# Patient Record
Sex: Female | Born: 1960
Health system: Southern US, Community
[De-identification: ages and names within clinical notes are randomized; demographics above are authoritative.]

## PROBLEM LIST (undated history)

## (undated) DIAGNOSIS — E7800 Pure hypercholesterolemia, unspecified: Secondary | ICD-10-CM

## (undated) DIAGNOSIS — R000 Tachycardia, unspecified: Secondary | ICD-10-CM

## (undated) DIAGNOSIS — I10 Essential (primary) hypertension: Secondary | ICD-10-CM

## (undated) DIAGNOSIS — H409 Unspecified glaucoma: Secondary | ICD-10-CM

## (undated) HISTORY — PX: NO PAST SURGERIES: SHX2092

## (undated) HISTORY — PX: OTHER SURGICAL HISTORY: SHX169

## (undated) HISTORY — DX: Essential (primary) hypertension: I10

---

## 2002-09-29 ENCOUNTER — Other Ambulatory Visit: Admission: RE | Admit: 2002-09-29 | Discharge: 2002-09-29 | Payer: Self-pay | Admitting: Obstetrics & Gynecology

## 2003-09-30 ENCOUNTER — Ambulatory Visit (HOSPITAL_COMMUNITY): Admission: RE | Admit: 2003-09-30 | Discharge: 2003-09-30 | Payer: Self-pay | Admitting: Family Medicine

## 2010-09-25 ENCOUNTER — Ambulatory Visit (HOSPITAL_COMMUNITY)
Admission: RE | Admit: 2010-09-25 | Discharge: 2010-09-25 | Payer: Self-pay | Source: Home / Self Care | Attending: Internal Medicine | Admitting: Internal Medicine

## 2010-10-14 ENCOUNTER — Encounter: Payer: Self-pay | Admitting: Family Medicine

## 2011-11-12 ENCOUNTER — Other Ambulatory Visit (HOSPITAL_COMMUNITY): Payer: Self-pay | Admitting: Internal Medicine

## 2011-11-13 ENCOUNTER — Other Ambulatory Visit (HOSPITAL_COMMUNITY): Payer: Self-pay | Admitting: Internal Medicine

## 2011-11-13 DIAGNOSIS — E785 Hyperlipidemia, unspecified: Secondary | ICD-10-CM

## 2011-11-13 DIAGNOSIS — Z139 Encounter for screening, unspecified: Secondary | ICD-10-CM

## 2011-11-13 DIAGNOSIS — Z01419 Encounter for gynecological examination (general) (routine) without abnormal findings: Secondary | ICD-10-CM

## 2011-11-13 DIAGNOSIS — R51 Headache: Secondary | ICD-10-CM

## 2011-11-15 ENCOUNTER — Ambulatory Visit (HOSPITAL_COMMUNITY)
Admission: RE | Admit: 2011-11-15 | Discharge: 2011-11-15 | Disposition: A | Discharge status: Home / Self Care | Payer: BC Managed Care – PPO | Source: Ambulatory Visit | Attending: Internal Medicine | Admitting: Internal Medicine

## 2011-11-15 DIAGNOSIS — R51 Headache: Secondary | ICD-10-CM

## 2011-11-21 ENCOUNTER — Ambulatory Visit (HOSPITAL_COMMUNITY): Payer: BC Managed Care – PPO

## 2011-12-02 ENCOUNTER — Ambulatory Visit (HOSPITAL_COMMUNITY): Payer: BC Managed Care – PPO

## 2012-06-19 ENCOUNTER — Other Ambulatory Visit (HOSPITAL_COMMUNITY): Payer: Self-pay | Admitting: Internal Medicine

## 2012-06-19 DIAGNOSIS — Z01419 Encounter for gynecological examination (general) (routine) without abnormal findings: Secondary | ICD-10-CM

## 2012-06-25 ENCOUNTER — Ambulatory Visit (HOSPITAL_COMMUNITY): Payer: BC Managed Care – PPO

## 2012-06-25 ENCOUNTER — Other Ambulatory Visit (HOSPITAL_COMMUNITY): Payer: BC Managed Care – PPO

## 2012-06-26 ENCOUNTER — Other Ambulatory Visit (HOSPITAL_COMMUNITY): Payer: Self-pay | Admitting: Internal Medicine

## 2012-06-26 ENCOUNTER — Ambulatory Visit (HOSPITAL_COMMUNITY): Payer: BC Managed Care – PPO

## 2012-06-26 ENCOUNTER — Ambulatory Visit (HOSPITAL_COMMUNITY)
Admission: RE | Admit: 2012-06-26 | Discharge: 2012-06-26 | Disposition: A | Discharge status: Home / Self Care | Payer: BC Managed Care – PPO | Source: Ambulatory Visit | Attending: Internal Medicine | Admitting: Internal Medicine

## 2012-06-26 DIAGNOSIS — R221 Localized swelling, mass and lump, neck: Secondary | ICD-10-CM | POA: Insufficient documentation

## 2012-06-26 DIAGNOSIS — Z01419 Encounter for gynecological examination (general) (routine) without abnormal findings: Secondary | ICD-10-CM

## 2012-06-26 DIAGNOSIS — R220 Localized swelling, mass and lump, head: Secondary | ICD-10-CM | POA: Insufficient documentation

## 2012-06-26 DIAGNOSIS — M79609 Pain in unspecified limb: Secondary | ICD-10-CM | POA: Insufficient documentation

## 2012-06-26 DIAGNOSIS — I10 Essential (primary) hypertension: Secondary | ICD-10-CM | POA: Insufficient documentation

## 2012-12-22 ENCOUNTER — Other Ambulatory Visit (HOSPITAL_COMMUNITY): Payer: Self-pay | Admitting: Internal Medicine

## 2012-12-22 DIAGNOSIS — R1013 Epigastric pain: Secondary | ICD-10-CM

## 2012-12-22 DIAGNOSIS — Z0000 Encounter for general adult medical examination without abnormal findings: Secondary | ICD-10-CM

## 2012-12-25 ENCOUNTER — Ambulatory Visit (HOSPITAL_COMMUNITY): Payer: BC Managed Care – PPO

## 2012-12-28 ENCOUNTER — Ambulatory Visit (HOSPITAL_COMMUNITY): Payer: BC Managed Care – PPO

## 2012-12-31 ENCOUNTER — Ambulatory Visit (HOSPITAL_COMMUNITY)
Admission: RE | Admit: 2012-12-31 | Discharge: 2012-12-31 | Disposition: A | Discharge status: Home / Self Care | Payer: BC Managed Care – PPO | Source: Ambulatory Visit | Attending: Internal Medicine | Admitting: Internal Medicine

## 2012-12-31 DIAGNOSIS — Z1231 Encounter for screening mammogram for malignant neoplasm of breast: Secondary | ICD-10-CM | POA: Diagnosis not present

## 2012-12-31 DIAGNOSIS — Z0000 Encounter for general adult medical examination without abnormal findings: Secondary | ICD-10-CM

## 2012-12-31 DIAGNOSIS — R1011 Right upper quadrant pain: Secondary | ICD-10-CM | POA: Diagnosis present

## 2012-12-31 DIAGNOSIS — R1013 Epigastric pain: Secondary | ICD-10-CM

## 2014-06-22 ENCOUNTER — Other Ambulatory Visit (HOSPITAL_COMMUNITY): Payer: Self-pay | Admitting: Physician Assistant

## 2014-06-22 ENCOUNTER — Ambulatory Visit (HOSPITAL_COMMUNITY)
Admission: RE | Admit: 2014-06-22 | Discharge: 2014-06-22 | Disposition: A | Discharge status: Home / Self Care | Payer: BC Managed Care – PPO | Source: Ambulatory Visit | Attending: Physician Assistant | Admitting: Physician Assistant

## 2014-06-22 DIAGNOSIS — M79672 Pain in left foot: Secondary | ICD-10-CM

## 2014-06-22 DIAGNOSIS — M79609 Pain in unspecified limb: Secondary | ICD-10-CM | POA: Insufficient documentation

## 2014-06-22 DIAGNOSIS — M79671 Pain in right foot: Secondary | ICD-10-CM

## 2015-02-03 ENCOUNTER — Ambulatory Visit (HOSPITAL_COMMUNITY)
Admission: RE | Admit: 2015-02-03 | Discharge: 2015-02-03 | Disposition: A | Discharge status: Home / Self Care | Payer: BLUE CROSS/BLUE SHIELD | Source: Ambulatory Visit | Attending: Internal Medicine | Admitting: Internal Medicine

## 2015-02-03 ENCOUNTER — Other Ambulatory Visit (HOSPITAL_COMMUNITY): Payer: Self-pay | Admitting: Internal Medicine

## 2015-02-03 DIAGNOSIS — R079 Chest pain, unspecified: Secondary | ICD-10-CM | POA: Diagnosis present

## 2015-02-15 ENCOUNTER — Encounter (INDEPENDENT_AMBULATORY_CARE_PROVIDER_SITE_OTHER): Payer: Self-pay | Admitting: *Deleted

## 2015-06-05 ENCOUNTER — Other Ambulatory Visit (HOSPITAL_COMMUNITY): Payer: Self-pay | Admitting: Internal Medicine

## 2015-06-05 DIAGNOSIS — M545 Low back pain: Secondary | ICD-10-CM

## 2015-06-05 DIAGNOSIS — M546 Pain in thoracic spine: Secondary | ICD-10-CM

## 2016-01-18 DIAGNOSIS — H401122 Primary open-angle glaucoma, left eye, moderate stage: Secondary | ICD-10-CM | POA: Diagnosis not present

## 2016-03-27 DIAGNOSIS — R300 Dysuria: Secondary | ICD-10-CM | POA: Diagnosis not present

## 2016-03-27 DIAGNOSIS — Z1389 Encounter for screening for other disorder: Secondary | ICD-10-CM | POA: Diagnosis not present

## 2016-03-27 DIAGNOSIS — Z6828 Body mass index (BMI) 28.0-28.9, adult: Secondary | ICD-10-CM | POA: Diagnosis not present

## 2016-03-27 DIAGNOSIS — Z0001 Encounter for general adult medical examination with abnormal findings: Secondary | ICD-10-CM | POA: Diagnosis not present

## 2016-03-27 DIAGNOSIS — I10 Essential (primary) hypertension: Secondary | ICD-10-CM | POA: Diagnosis not present

## 2016-03-27 DIAGNOSIS — R010 Benign and innocent cardiac murmurs: Secondary | ICD-10-CM | POA: Diagnosis not present

## 2016-03-27 DIAGNOSIS — E663 Overweight: Secondary | ICD-10-CM | POA: Diagnosis not present

## 2016-03-29 DIAGNOSIS — Z6828 Body mass index (BMI) 28.0-28.9, adult: Secondary | ICD-10-CM | POA: Diagnosis not present

## 2016-03-29 DIAGNOSIS — Z1389 Encounter for screening for other disorder: Secondary | ICD-10-CM | POA: Diagnosis not present

## 2016-03-29 DIAGNOSIS — E663 Overweight: Secondary | ICD-10-CM | POA: Diagnosis not present

## 2016-05-20 ENCOUNTER — Encounter (INDEPENDENT_AMBULATORY_CARE_PROVIDER_SITE_OTHER): Payer: Self-pay

## 2016-05-20 ENCOUNTER — Encounter: Payer: Self-pay | Admitting: Internal Medicine

## 2016-05-20 ENCOUNTER — Ambulatory Visit (INDEPENDENT_AMBULATORY_CARE_PROVIDER_SITE_OTHER): Payer: BLUE CROSS/BLUE SHIELD | Admitting: Internal Medicine

## 2016-05-20 VITALS — BP 116/78 | HR 80 | Ht 65.0 in | Wt 180.0 lb

## 2016-05-20 DIAGNOSIS — R011 Cardiac murmur, unspecified: Secondary | ICD-10-CM | POA: Diagnosis not present

## 2016-05-20 NOTE — Patient Instructions (Signed)
Your physician recommends that you schedule a follow-up appointment in:  To be determined after echo    Your physician has requested that you have an echocardiogram. Echocardiography is a painless test that uses sound waves to create images of your heart. It provides your doctor with information about the size and shape of your heart and how well your heart's chambers and valves are working. This procedure takes approximately one hour. There are no restrictions for this procedure.     Your physician recommends that you continue on your current medications as directed. Please refer to the Current Medication list given to you today.     Thank you for choosing [facility] !         

## 2016-05-20 NOTE — Progress Notes (Signed)
**Note Jodi-Identified via Obfuscation**    Cardiology Office Note   Date:  05/20/2016   ID:  Jodi Hollingsheaderesa C Turney, DOB Mar 21, 1961, MRN [id number]  PCP:  Cassell SmilesFUSCO,LAWRENCE J., MD  Cardiologist:   Dietrich PatesPaula Kiora Hallberg, MD     Pt referred for murmur   History of Present Illness: Jodi Richardson is a 55 y.o. female with a history of HTN, HL  Referred for LD  On 01/2015 was 204  HDL was 65   Seen by Dr Sherwood GamblerFusco  Soft murmur heard Pt sits and moves  Some at work    Not much walking   No walking    Started having some pain under L breat when laid down  ot with activity   Sl pain at work      Outpatient Medications Prior to Visit  Medication Sig Dispense Refill  . losartan-hydrochlorothiazide (HYZAAR) 50-12.5 MG tablet Take 1 tablet by mouth daily.    Marland Kitchen. aspirin EC 81 MG tablet Take 81 mg by mouth daily.     No facility-administered medications prior to visit.    Fish oil   Eye drops (glaucoma)   Allergies:   Review of patient's allergies indicates no known allergies.     HTN    No past surgical history on file.     Social History:  The patient  reports that she has never smoked. She has never used smokeless tobacco. She reports that she does not drink alcohol or use drugs.   Family History:  The patient's family history is not on file.   Dad CVA HTN  ? Stent   Mom PAD  Stent to leg Brother/sisters:  Most with HTn  Chol high     ROS:  Please see the history of present illness. All other systems are reviewed and  Negative to the above problem except as noted.    PHYSICAL EXAM: VS:  BP 116/78 (BP Location: Right Arm)   Pulse 80   Ht 5\' 5"  (1.651 m)   Wt 180 lb (81.6 kg)   SpO2 99%   BMI 29.95 kg/m   GEN: Well nourished, well developed, in no acute distress  HEENT: normal  Neck: no JVD, carotid bruits, or masses Cardiac: RRR; Gr I-II/VI systolic murmur base , rubs, or gallops,Tr edema feeet Respiratory:  clear to auscultation bilaterally, normal work of breathing GI: soft, nontender, nondistended, + BS  No hepatomegaly  MS:  no deformity Moving all extremities   Skin: warm and dry, no rash Neuro:  Strength and sensation are intact Psych: euthymic mood, full affect   EKG:  EKG is ordered today.  SR 66 bpm    Lipid Panel No results found for: CHOL, TRIG, HDL, CHOLHDL, VLDL, LDLCALC, LDLDIRECT    Wt Readings from Last 3 Encounters:  05/20/16 180 lb (81.6 kg)      ASSESSMENT AND PLAN:  1  Murmur  Soft systolic   Probable aortic sclerosis  Will get echo  2  HL  Profound  LDL over 200  Will get this years labs  Prob needs statin  3.  Feet  [facility]  [facility]  Would do better if could walk     Signed, Dietrich PatesPaula Phala Schraeder, MD  05/20/2016 2:18 PM    [facility] [address], [place], [place]  [address] Phone: [phone]) [phone]; Fax: [phone]) [phone]

## 2016-05-24 ENCOUNTER — Telehealth: Payer: Self-pay

## 2016-05-24 ENCOUNTER — Other Ambulatory Visit: Payer: Self-pay

## 2016-05-24 DIAGNOSIS — E785 Hyperlipidemia, unspecified: Secondary | ICD-10-CM

## 2016-05-24 MED ORDER — ROSUVASTATIN CALCIUM 20 MG PO TABS: 20.0000 mg | ORAL_TABLET | Freq: Every day | ORAL | 3 refills | Status: DC

## 2016-05-24 NOTE — Telephone Encounter (Signed)
Send message to pt via my chart regarding lipid results

## 2016-06-17 ENCOUNTER — Encounter: Payer: Self-pay | Admitting: Internal Medicine

## 2016-06-21 ENCOUNTER — Other Ambulatory Visit (HOSPITAL_COMMUNITY): Payer: BLUE CROSS/BLUE SHIELD

## 2016-06-27 ENCOUNTER — Ambulatory Visit (HOSPITAL_COMMUNITY)
Admission: RE | Admit: 2016-06-27 | Discharge: 2016-06-27 | Disposition: A | Discharge status: Home / Self Care | Payer: BLUE CROSS/BLUE SHIELD | Source: Ambulatory Visit | Attending: Internal Medicine | Admitting: Internal Medicine

## 2016-06-27 DIAGNOSIS — I371 Nonrheumatic pulmonary valve insufficiency: Secondary | ICD-10-CM | POA: Insufficient documentation

## 2016-06-27 DIAGNOSIS — I071 Rheumatic tricuspid insufficiency: Secondary | ICD-10-CM | POA: Diagnosis not present

## 2016-06-27 DIAGNOSIS — R011 Cardiac murmur, unspecified: Secondary | ICD-10-CM | POA: Diagnosis not present

## 2016-06-27 LAB — ECHOCARDIOGRAM COMPLETE
AVLVOTPG: 6 mmHg
CHL CUP DOP CALC LVOT VTI: 30.8 cm
CHL CUP RV SYS PRESS: 26 mmHg
E/e' ratio: 9.6
EWDT: 208 ms
FS: 43 % (ref 28–44)
IVS/LV PW RATIO, ED: 0.91
LA ID, A-P, ES: 35 mm
LA vol index: 24.1 mL/m2
LA vol: 47.2 mL
LADIAMINDEX: 1.79 cm/m2
LAVOLA4C: 43.3 mL
LEFT ATRIUM END SYS DIAM: 35 mm
LV E/e' medial: 9.6
LV E/e'average: 9.6
LV PW d: 9.17 mm — AB (ref 0.6–1.1)
LV SIMPSON'S DISK: 64
LV TDI E'MEDIAL: 7.18
LV dias vol index: 25 mL/m2
LV dias vol: 48 mL (ref 46–106)
LV e' LATERAL: 9.25 cm/s
LVOT SV: 78 mL
LVOT area: 2.54 cm2
LVOT peak vel: 123 cm/s
LVOTD: 18 mm
LVSYSVOL: 17 mL (ref 14–42)
LVSYSVOLIN: 9 mL/m2
MV Dec: 208
MV Peak grad: 3 mmHg
MV pk A vel: 89.1 m/s
MVPKEVEL: 88.8 m/s
RV LATERAL S' VELOCITY: 14.4 cm/s
RV TAPSE: 28.5 mm
Reg peak vel: 241 cm/s
Stroke v: 31 ml
TDI e' lateral: 9.25
TRMAXVEL: 241 cm/s

## 2016-06-27 NOTE — Progress Notes (Signed)
*  PRELIMINARY RESULTS* Echocardiogram 2D Echocardiogram has been performed.  Stacey DrainWhite, Shahad Mazurek J 06/27/2016, 3:06 PM

## 2016-07-19 DIAGNOSIS — Z1231 Encounter for screening mammogram for malignant neoplasm of breast: Secondary | ICD-10-CM | POA: Diagnosis not present

## 2016-08-14 DIAGNOSIS — E785 Hyperlipidemia, unspecified: Secondary | ICD-10-CM | POA: Diagnosis not present

## 2016-08-14 LAB — LIPID PANEL
CHOL/HDL RATIO: 3 ratio (ref <5.0)
CHOLESTEROL: 197 mg/dL (ref <200)
HDL: 66 mg/dL (ref >50)
LDL CALC: 112 mg/dL — AB (ref <100)
Triglycerides: 96 mg/dL (ref <150)
VLDL: 19 mg/dL (ref <30)

## 2016-08-15 LAB — AST: AST: 41 U/L — AB (ref 10–35)

## 2016-08-19 ENCOUNTER — Telehealth: Payer: Self-pay | Admitting: Internal Medicine

## 2016-08-19 NOTE — Telephone Encounter (Signed)
Patient given lab results. 

## 2016-08-19 NOTE — Telephone Encounter (Signed)
Lvm wanting her lab results--please give her a call @ [phone]

## 2016-08-30 DIAGNOSIS — Z01419 Encounter for gynecological examination (general) (routine) without abnormal findings: Secondary | ICD-10-CM | POA: Diagnosis not present

## 2016-08-30 DIAGNOSIS — Z124 Encounter for screening for malignant neoplasm of cervix: Secondary | ICD-10-CM | POA: Diagnosis not present

## 2016-08-30 DIAGNOSIS — Z6830 Body mass index (BMI) 30.0-30.9, adult: Secondary | ICD-10-CM | POA: Diagnosis not present

## 2016-10-31 ENCOUNTER — Encounter: Payer: Self-pay | Admitting: Internal Medicine

## 2016-11-26 ENCOUNTER — Ambulatory Visit: Payer: BLUE CROSS/BLUE SHIELD | Admitting: Family Medicine

## 2016-12-02 ENCOUNTER — Encounter: Payer: Self-pay | Admitting: Family Medicine

## 2016-12-03 NOTE — Progress Notes (Deleted)
**Note Jodi-Identified via Obfuscation**   Tawana ScaleZach Smith D.O. [facility] [address] [place], [place] [address] Phone: [phone] Subjective:    I'm seeing this patient by the request  of:  Dietrich PatesPaula Ross MD.   CC: Bilateral foot pain and swelling  BJY:[id number]:Subjective  Jodi Richardson is a 56 y.o. female coming in with complaint of Bilateral foot pain and swelling     No past medical history on file. No past surgical history on file. Social History   Social History  . Marital status: Married    Spouse name: N/A  . Number of children: N/A  . Years of education: N/A   Social History Main Topics  . Smoking status: Never Smoker  . Smokeless tobacco: Never Used  . Alcohol use No  . Drug use: No  . Sexual activity: Not on file   Other Topics Concern  . Not on file   Social History Narrative  . No narrative on file   No Known Allergies No family history on file.  Past medical history, social, surgical and family history all reviewed in electronic medical record.  No pertanent information unless stated regarding to the chief complaint.   Review of Systems:Review of systems updated and as accurate as of 12/03/16  No headache, visual changes, nausea, vomiting, diarrhea, constipation, dizziness, abdominal pain, skin rash, fevers, chills, night sweats, weight loss, swollen lymph nodes, body aches, joint swelling, muscle aches, chest pain, shortness of breath, mood changes.   Objective  There were no vitals taken for this visit. Systems examined below as of 12/03/16   General: No apparent distress alert and oriented x3 mood and affect normal, dressed appropriately.  HEENT: Pupils equal, extraocular movements intact  Respiratory: Patient's speak in full sentences and does not appear short of breath  Cardiovascular: No lower extremity edema, non tender, no erythema  Skin: Warm dry intact with no signs of infection or rash on extremities or on axial skeleton.  Abdomen: Soft nontender  Neuro: Cranial nerves II  through XII are intact, neurovascularly intact in all extremities with 2+ DTRs and 2+ pulses.  Lymph: No lymphadenopathy of posterior or anterior cervical chain or axillae bilaterally.  Gait normal with good balance and coordination.  MSK:  Non tender with full range of motion and good stability and symmetric strength and tone of shoulders, elbows, wrist, hip, knee and ankles bilaterally.     Impression and Recommendations:     This case required medical decision making of moderate complexity.      Note: This dictation was prepared with Dragon dictation along with smaller phrase technology. Any transcriptional errors that result from this process are unintentional.

## 2016-12-04 ENCOUNTER — Ambulatory Visit: Payer: BLUE CROSS/BLUE SHIELD | Admitting: Family Medicine

## 2016-12-18 ENCOUNTER — Encounter: Payer: Self-pay | Admitting: Family Medicine

## 2016-12-19 ENCOUNTER — Ambulatory Visit: Payer: BLUE CROSS/BLUE SHIELD | Admitting: Family Medicine

## 2017-03-25 DIAGNOSIS — I10 Essential (primary) hypertension: Secondary | ICD-10-CM | POA: Diagnosis not present

## 2017-03-25 DIAGNOSIS — Z0001 Encounter for general adult medical examination with abnormal findings: Secondary | ICD-10-CM | POA: Diagnosis not present

## 2017-03-25 DIAGNOSIS — Z6828 Body mass index (BMI) 28.0-28.9, adult: Secondary | ICD-10-CM | POA: Diagnosis not present

## 2017-03-25 DIAGNOSIS — R350 Frequency of micturition: Secondary | ICD-10-CM | POA: Diagnosis not present

## 2017-03-25 DIAGNOSIS — B351 Tinea unguium: Secondary | ICD-10-CM | POA: Diagnosis not present

## 2017-03-25 DIAGNOSIS — Z1389 Encounter for screening for other disorder: Secondary | ICD-10-CM | POA: Diagnosis not present

## 2017-03-25 DIAGNOSIS — E782 Mixed hyperlipidemia: Secondary | ICD-10-CM | POA: Diagnosis not present

## 2017-03-25 DIAGNOSIS — E663 Overweight: Secondary | ICD-10-CM | POA: Diagnosis not present

## 2017-06-26 DIAGNOSIS — Z6829 Body mass index (BMI) 29.0-29.9, adult: Secondary | ICD-10-CM | POA: Diagnosis not present

## 2017-06-26 DIAGNOSIS — E663 Overweight: Secondary | ICD-10-CM | POA: Diagnosis not present

## 2017-06-26 DIAGNOSIS — N390 Urinary tract infection, site not specified: Secondary | ICD-10-CM | POA: Diagnosis not present

## 2017-06-28 DIAGNOSIS — R319 Hematuria, unspecified: Secondary | ICD-10-CM | POA: Diagnosis not present

## 2017-06-28 DIAGNOSIS — N390 Urinary tract infection, site not specified: Secondary | ICD-10-CM | POA: Diagnosis not present

## 2017-06-28 DIAGNOSIS — M545 Low back pain: Secondary | ICD-10-CM | POA: Diagnosis not present

## 2017-07-18 DIAGNOSIS — Z713 Dietary counseling and surveillance: Secondary | ICD-10-CM | POA: Diagnosis not present

## 2017-08-01 DIAGNOSIS — Z713 Dietary counseling and surveillance: Secondary | ICD-10-CM | POA: Diagnosis not present

## 2017-08-22 DIAGNOSIS — Z713 Dietary counseling and surveillance: Secondary | ICD-10-CM | POA: Diagnosis not present

## 2017-09-05 DIAGNOSIS — Z713 Dietary counseling and surveillance: Secondary | ICD-10-CM | POA: Diagnosis not present

## 2017-09-09 DIAGNOSIS — Z713 Dietary counseling and surveillance: Secondary | ICD-10-CM | POA: Diagnosis not present

## 2017-10-17 DIAGNOSIS — H401122 Primary open-angle glaucoma, left eye, moderate stage: Secondary | ICD-10-CM | POA: Diagnosis not present

## 2017-10-17 DIAGNOSIS — H40001 Preglaucoma, unspecified, right eye: Secondary | ICD-10-CM | POA: Diagnosis not present

## 2017-12-12 DIAGNOSIS — Z6829 Body mass index (BMI) 29.0-29.9, adult: Secondary | ICD-10-CM | POA: Diagnosis not present

## 2017-12-12 DIAGNOSIS — Z1389 Encounter for screening for other disorder: Secondary | ICD-10-CM | POA: Diagnosis not present

## 2017-12-12 DIAGNOSIS — R350 Frequency of micturition: Secondary | ICD-10-CM | POA: Diagnosis not present

## 2017-12-12 DIAGNOSIS — E663 Overweight: Secondary | ICD-10-CM | POA: Diagnosis not present

## 2017-12-12 DIAGNOSIS — M62830 Muscle spasm of back: Secondary | ICD-10-CM | POA: Diagnosis not present

## 2018-01-08 DIAGNOSIS — N390 Urinary tract infection, site not specified: Secondary | ICD-10-CM | POA: Diagnosis not present

## 2018-01-08 DIAGNOSIS — M545 Low back pain: Secondary | ICD-10-CM | POA: Diagnosis not present

## 2018-01-08 DIAGNOSIS — Z6829 Body mass index (BMI) 29.0-29.9, adult: Secondary | ICD-10-CM | POA: Diagnosis not present

## 2018-01-08 DIAGNOSIS — M79644 Pain in right finger(s): Secondary | ICD-10-CM | POA: Diagnosis not present

## 2018-01-08 DIAGNOSIS — E663 Overweight: Secondary | ICD-10-CM | POA: Diagnosis not present

## 2018-01-08 DIAGNOSIS — M539 Dorsopathy, unspecified: Secondary | ICD-10-CM | POA: Diagnosis not present

## 2018-02-05 ENCOUNTER — Ambulatory Visit: Payer: BLUE CROSS/BLUE SHIELD | Admitting: Orthopaedic Surgery

## 2018-02-05 ENCOUNTER — Ambulatory Visit (INDEPENDENT_AMBULATORY_CARE_PROVIDER_SITE_OTHER): Payer: BLUE CROSS/BLUE SHIELD

## 2018-02-05 ENCOUNTER — Encounter: Payer: Self-pay | Admitting: Orthopaedic Surgery

## 2018-02-05 VITALS — BP 148/91 | HR 84 | Ht 65.0 in | Wt 180.0 lb

## 2018-02-05 DIAGNOSIS — Z87440 Personal history of urinary (tract) infections: Secondary | ICD-10-CM

## 2018-02-05 DIAGNOSIS — G8929 Other chronic pain: Secondary | ICD-10-CM

## 2018-02-05 DIAGNOSIS — M79644 Pain in right finger(s): Secondary | ICD-10-CM | POA: Diagnosis not present

## 2018-02-05 DIAGNOSIS — M5442 Lumbago with sciatica, left side: Secondary | ICD-10-CM

## 2018-02-05 DIAGNOSIS — Z8269 Family history of other diseases of the musculoskeletal system and connective tissue: Secondary | ICD-10-CM

## 2018-02-05 MED ORDER — NAPROXEN 500 MG PO TABS: 500.0000 mg | ORAL_TABLET | Freq: Two times a day (BID) | ORAL | 5 refills | Status: DC

## 2018-02-05 MED ORDER — HYDROCODONE-ACETAMINOPHEN 5-325 MG PO TABS: 1.0000 | ORAL_TABLET | ORAL | 0 refills | Status: AC | PRN

## 2018-02-05 NOTE — Progress Notes (Signed)
Subjective: right index finger pain and lower back pain left.    Patient ID: Jodi Richardson, female    DOB: 14-Nov-1960, 57 y.o.   MRN: [id number]  HPI She has history of right index finger swelling for about six weeks.  She has seen her family doctor for this and was given prednisone shot.  She got better but she still has swelling of the finger of the dominant index on the right.  She has no trauma, no puncture.  She has history of UTI last year that got worse.  She was treated for this but has had recurrent feeling of the pain she had on her flank over the last month.  She is concerned she may have a new UTI.  She had no burning pain then and not now.  She has no history of kidney stone.  She has pain of the left lower back with radiation of pain to the left hip and upper thigh on the left.  She has pain bending over and pain rolling over in bed.  She has no weakness or numbness.  She has no trauma.  She has taken ibuprofen which helped.   Review of Systems  Constitutional: Positive for activity change.  Genitourinary: Positive for urgency.  Musculoskeletal: Positive for arthralgias, back pain and myalgias.  All other systems reviewed and are negative.  Past Medical History:  Diagnosis Date  . Hypertension     Past Surgical History:  Procedure Laterality Date  . none      Current Outpatient Medications on File Prior to Visit  Medication Sig Dispense Refill  . aspirin EC 81 MG tablet Take 81 mg by mouth daily.    . bimatoprost (LUMIGAN) 0.03 % ophthalmic solution Place 1 drop into both eyes at bedtime.    . cholecalciferol (VITAMIN D) 1000 units tablet Take 1,000 Units by mouth daily.    . cyclobenzaprine (FLEXERIL) 5 MG tablet   0  . dorzolamide-timolol (COSOPT) 22.3-6.8 MG/ML ophthalmic solution Place 1 drop into both eyes 2 (two) times daily.    Marland Kitchen losartan-hydrochlorothiazide (HYZAAR) 50-12.5 MG tablet Take 1 tablet by mouth daily.    . Omega-3 Fatty Acids (FISH OIL) 1000  MG CAPS Take 1,000 mg by mouth daily.    . rosuvastatin (CRESTOR) 20 MG tablet Take 1 tablet (20 mg total) by mouth daily. 90 tablet 3  . vitamin B-12 (CYANOCOBALAMIN) 1000 MCG tablet Take 1,000 mcg by mouth daily.     No current facility-administered medications on file prior to visit.     Social History   Socioeconomic History  . Marital status: Married    Spouse name: Not on file  . Number of children: Not on file  . Years of education: Not on file  . Highest education level: Not on file  Occupational History  . Not on file  Social Needs  . Financial resource strain: Not on file  . Food insecurity:    Worry: Not on file    Inability: Not on file  . Transportation needs:    Medical: Not on file    Non-medical: Not on file  Tobacco Use  . Smoking status: Never Smoker  . Smokeless tobacco: Never Used  Substance and Sexual Activity  . Alcohol use: No  . Drug use: No  . Sexual activity: Not on file  Lifestyle  . Physical activity:    Days per week: Not on file    Minutes per session: Not on file  .  Stress: Not on file  Relationships  . Social connections:    Talks on phone: Not on file    Gets together: Not on file    Attends religious service: Not on file    Active member of club or organization: Not on file    Attends meetings of clubs or organizations: Not on file    Relationship status: Not on file  . Intimate partner violence:    Fear of current or ex partner: Not on file    Emotionally abused: Not on file    Physically abused: Not on file    Forced sexual activity: Not on file  Other Topics Concern  . Not on file  Social History Narrative  . Not on file    Family History  Problem Relation Age of Onset  . High blood pressure Mother   . High blood pressure Father   . High blood pressure Sister   . Diabetes Sister   . High blood pressure Brother   . Diabetes Brother     BP (!) 148/91   Pulse 84   Ht  (1.651 m)   Wt 180 lb (81.6 kg)   BMI  29.95 kg/m       Objective:   Physical Exam  Constitutional: She is oriented to person, place, and time. She appears well-developed and well-nourished.  HENT:  Head: Normocephalic and atraumatic.  Eyes: Pupils are equal, round, and reactive to light. Conjunctivae and EOM are normal.  Neck: Normal range of motion. Neck supple.  Cardiovascular: Normal rate, regular rhythm and intact distal pulses.  Pulmonary/Chest: Effort normal.  Abdominal: Soft.  Musculoskeletal:       Lumbar back: She exhibits decreased range of motion, tenderness, bony tenderness and pain.       Back:       Hands: Neurological: She is alert and oriented to person, place, and time. She has normal reflexes. She displays normal reflexes. No cranial nerve deficit. She exhibits normal muscle tone. Coordination normal.  Skin: Skin is warm and dry.  Psychiatric: She has a normal mood and affect. Her behavior is normal. Judgment and thought content normal.    X-rays of the right index finger and lower back done, reported separately.      Assessment & Plan:   Encounter Diagnoses  Name Primary?  . History of UTI Yes  . Chronic left-sided low back pain with left-sided sciatica   . Finger pain, right   . Family history of gout    There is a family history of gout.  I would like to get serum uric acid.  I will get urine culture to rule out UTI.  I will begin Naprosyn 500 po bid pc, precautions discussed.  Take Tylenol if needed for additional pain coverage,  Do not take Advil or Aleve.  Return in two weeks.  Call if any problem.  Precautions discussed.   She may need MRI of the lumbar spine.  Electronically Signed Darreld Mclean, MD 5/16/201911:15 AM

## 2018-02-06 LAB — URINE CULTURE
MICRO NUMBER:: 90597834
SPECIMEN QUALITY: ADEQUATE

## 2018-02-06 LAB — URIC ACID: URIC ACID, SERUM: 3.1 mg/dL (ref 2.5–7.0)

## 2018-02-13 ENCOUNTER — Encounter: Payer: Self-pay | Admitting: Orthopaedic Surgery

## 2018-02-18 ENCOUNTER — Encounter: Payer: Self-pay | Admitting: Orthopaedic Surgery

## 2018-02-18 ENCOUNTER — Ambulatory Visit: Payer: BLUE CROSS/BLUE SHIELD | Admitting: Orthopaedic Surgery

## 2018-02-18 VITALS — BP 132/81 | HR 80 | Temp 98.3°F | Ht 65.0 in | Wt 182.0 lb

## 2018-02-18 DIAGNOSIS — M5442 Lumbago with sciatica, left side: Secondary | ICD-10-CM | POA: Diagnosis not present

## 2018-02-18 DIAGNOSIS — G8929 Other chronic pain: Secondary | ICD-10-CM | POA: Diagnosis not present

## 2018-02-18 DIAGNOSIS — M79644 Pain in right finger(s): Secondary | ICD-10-CM

## 2018-02-18 DIAGNOSIS — Z87440 Personal history of urinary (tract) infections: Secondary | ICD-10-CM | POA: Diagnosis not present

## 2018-02-18 NOTE — Progress Notes (Signed)
Patient WU:JWJXBJ Jodi Richardson, female DOB:12-Oct-1960, 57 y.o. [id number]  Chief Complaint  Patient presents with  . Results    lab review  . Back Pain    feels better     HPI  Jodi Richardson is a 57 y.o. female who has lower back pain.  She is improved. The Naprosyn helped.  She has less pain in the back but still has left sided sciatica.  Her right index finger has less swelling and pain also.  I want her to continue the Naprosyn.   Her urine culture was negative.  I had sent an email to her about that.  Her serum uric acid was normal as well.     HPI  Body mass index is 30.29 kg/m.  ROS  Review of Systems  Constitutional: Positive for activity change.  Genitourinary: Positive for urgency.  Musculoskeletal: Positive for arthralgias, back pain and myalgias.  All other systems reviewed and are negative.   Past Medical History:  Diagnosis Date  . Hypertension     Past Surgical History:  Procedure Laterality Date  . none      Family History  Problem Relation Age of Onset  . High blood pressure Mother   . High blood pressure Father   . High blood pressure Sister   . Diabetes Sister   . High blood pressure Brother   . Diabetes Brother     Social History Social History   Tobacco Use  . Smoking status: Never Smoker  . Smokeless tobacco: Never Used  Substance Use Topics  . Alcohol use: No  . Drug use: No    No Known Allergies  Current Outpatient Medications  Medication Sig Dispense Refill  . aspirin EC 81 MG tablet Take 81 mg by mouth daily.    . bimatoprost (LUMIGAN) 0.03 % ophthalmic solution Place 1 drop into both eyes at bedtime.    . cholecalciferol (VITAMIN D) 1000 units tablet Take 1,000 Units by mouth daily.    . cyclobenzaprine (FLEXERIL) 5 MG tablet   0  . dorzolamide-timolol (COSOPT) 22.3-6.8 MG/ML ophthalmic solution Place 1 drop into both eyes 2 (two) times daily.    Marland Kitchen losartan-hydrochlorothiazide (HYZAAR) 50-12.5 MG tablet Take 1 tablet by  mouth daily.    . naproxen (NAPROSYN) 500 MG tablet Take 1 tablet (500 mg total) by mouth 2 (two) times daily with a meal. 60 tablet 5  . Omega-3 Fatty Acids (FISH OIL) 1000 MG CAPS Take 1,000 mg by mouth daily.    . rosuvastatin (CRESTOR) 20 MG tablet Take 1 tablet (20 mg total) by mouth daily. 90 tablet 3  . vitamin B-12 (CYANOCOBALAMIN) 1000 MCG tablet Take 1,000 mcg by mouth daily.     No current facility-administered medications for this visit.      Physical Exam  Blood pressure 132/81, pulse 80, temperature 98.3 F (36.8 C), height  (1.651 m), weight 182 lb (82.6 kg).  Constitutional: overall normal hygiene, normal nutrition, well developed, normal grooming, normal body habitus. Assistive device:none  Musculoskeletal: gait and station Limp none, muscle tone and strength are normal, no tremors or atrophy is present.  .  Neurological: coordination overall normal.  Deep tendon reflex/nerve stretch intact.  Sensation normal.  Cranial nerves II-XII intact.   Skin:   Normal overall no scars, lesions, ulcers or rashes. No psoriasis.  Psychiatric: Alert and oriented x 3.  Recent memory intact, remote memory unclear.  Normal mood and affect. Well groomed.  Good eye contact.  Cardiovascular:  overall no swelling, no varicosities, no edema bilaterally, normal temperatures of the legs and arms, no clubbing, cyanosis and good capillary refill.  Lymphatic: palpation is normal.  The right index finger still has slight fusiform swelling but it is less.  There is no redness. ROM is slightly better.  Spine/Pelvis examination:  Inspection:  Overall, sacoiliac joint benign and hips nontender; without crepitus or defects.   Thoracic spine inspection: Alignment normal without kyphosis present   Lumbar spine inspection:  Alignment  with normal lumbar lordosis, without scoliosis apparent.   Thoracic spine palpation:  without tenderness of spinal processes   Lumbar spine palpation: without  tenderness of lumbar area; without tightness of lumbar muscles    Range of Motion:   Lumbar flexion, forward flexion is normal without pain or tenderness    Lumbar extension is full without pain or tenderness   Left lateral bend is normal without pain or tenderness   Right lateral bend is normal without pain or tenderness   Straight leg raising is normal  Strength & tone: normal   Stability overall normal stability All other systems reviewed and are negative   The patient has been educated about the nature of the problem(s) and counseled on treatment options.  The patient appeared to understand what I have discussed and is in agreement with it.  Encounter Diagnoses  Name Primary?  . Chronic left-sided low back pain with left-sided sciatica Yes  . History of UTI   . Finger pain, right     PLAN Call if any problems.  Precautions discussed.  Continue current medications.   Return to clinic 1 month   Electronically Signed Darreld Mclean, MD 5/29/20194:11 PM

## 2018-03-11 DIAGNOSIS — N898 Other specified noninflammatory disorders of vagina: Secondary | ICD-10-CM | POA: Diagnosis not present

## 2018-03-11 DIAGNOSIS — N952 Postmenopausal atrophic vaginitis: Secondary | ICD-10-CM | POA: Diagnosis not present

## 2018-03-11 DIAGNOSIS — Z01411 Encounter for gynecological examination (general) (routine) with abnormal findings: Secondary | ICD-10-CM | POA: Diagnosis not present

## 2018-03-18 ENCOUNTER — Ambulatory Visit: Payer: BLUE CROSS/BLUE SHIELD | Admitting: Orthopaedic Surgery

## 2018-03-24 ENCOUNTER — Ambulatory Visit: Payer: BLUE CROSS/BLUE SHIELD | Admitting: Orthopaedic Surgery

## 2018-03-24 ENCOUNTER — Encounter: Payer: Self-pay | Admitting: Orthopaedic Surgery

## 2018-03-24 VITALS — BP 117/71 | HR 67 | Temp 98.3°F | Ht 65.0 in | Wt 179.0 lb

## 2018-03-24 DIAGNOSIS — G8929 Other chronic pain: Secondary | ICD-10-CM | POA: Diagnosis not present

## 2018-03-24 DIAGNOSIS — M5442 Lumbago with sciatica, left side: Secondary | ICD-10-CM | POA: Diagnosis not present

## 2018-03-24 NOTE — Progress Notes (Signed)
CC:  I feel much better  She has done well. She has much less lower back pain.  She is active and doing treadmill exercises twice a week now.  She is able to do most of what she wants.  NV intact. ROM is full.  Encounter Diagnosis  Name Primary?  . Chronic left-sided low back pain with left-sided sciatica Yes   I will see her as needed.  Call if any problem.  Precautions discussed.   Electronically Signed Darreld McleanWayne Asael Pann, MD 7/2/20192:44 PM

## 2018-05-14 DIAGNOSIS — E782 Mixed hyperlipidemia: Secondary | ICD-10-CM | POA: Diagnosis not present

## 2018-05-14 DIAGNOSIS — Z0001 Encounter for general adult medical examination with abnormal findings: Secondary | ICD-10-CM | POA: Diagnosis not present

## 2018-05-14 DIAGNOSIS — Z1389 Encounter for screening for other disorder: Secondary | ICD-10-CM | POA: Diagnosis not present

## 2018-05-14 DIAGNOSIS — I10 Essential (primary) hypertension: Secondary | ICD-10-CM | POA: Diagnosis not present

## 2018-05-14 DIAGNOSIS — E663 Overweight: Secondary | ICD-10-CM | POA: Diagnosis not present

## 2018-05-14 DIAGNOSIS — Z6828 Body mass index (BMI) 28.0-28.9, adult: Secondary | ICD-10-CM | POA: Diagnosis not present

## 2018-05-28 DIAGNOSIS — I10 Essential (primary) hypertension: Secondary | ICD-10-CM | POA: Diagnosis not present

## 2018-05-28 DIAGNOSIS — Z79899 Other long term (current) drug therapy: Secondary | ICD-10-CM | POA: Diagnosis not present

## 2018-05-28 DIAGNOSIS — E782 Mixed hyperlipidemia: Secondary | ICD-10-CM | POA: Diagnosis not present

## 2018-06-23 ENCOUNTER — Telehealth: Payer: Self-pay

## 2018-06-23 ENCOUNTER — Ambulatory Visit: Payer: BLUE CROSS/BLUE SHIELD

## 2018-06-23 NOTE — Telephone Encounter (Signed)
Patient was a no show and letter sent  °

## 2018-06-23 NOTE — Telephone Encounter (Signed)
noted 

## 2018-07-17 DIAGNOSIS — H401122 Primary open-angle glaucoma, left eye, moderate stage: Secondary | ICD-10-CM | POA: Diagnosis not present

## 2019-03-11 ENCOUNTER — Ambulatory Visit: Payer: BLUE CROSS/BLUE SHIELD | Admitting: Orthopaedic Surgery

## 2019-03-17 ENCOUNTER — Ambulatory Visit: Payer: BC Managed Care – PPO | Admitting: Orthopaedic Surgery

## 2019-03-18 ENCOUNTER — Ambulatory Visit: Payer: BC Managed Care – PPO | Admitting: Orthopaedic Surgery

## 2019-03-23 ENCOUNTER — Ambulatory Visit: Payer: BC Managed Care – PPO | Admitting: Orthopaedic Surgery

## 2019-03-23 ENCOUNTER — Ambulatory Visit (INDEPENDENT_AMBULATORY_CARE_PROVIDER_SITE_OTHER): Payer: BC Managed Care – PPO

## 2019-03-23 ENCOUNTER — Encounter: Payer: Self-pay | Admitting: Orthopaedic Surgery

## 2019-03-23 ENCOUNTER — Other Ambulatory Visit: Payer: Self-pay

## 2019-03-23 VITALS — BP 156/97 | HR 74 | Temp 99.1°F | Ht 65.0 in | Wt 188.0 lb

## 2019-03-23 DIAGNOSIS — M25561 Pain in right knee: Secondary | ICD-10-CM

## 2019-03-23 DIAGNOSIS — M25562 Pain in left knee: Secondary | ICD-10-CM

## 2019-03-23 DIAGNOSIS — G8929 Other chronic pain: Secondary | ICD-10-CM

## 2019-03-23 MED ORDER — NAPROXEN 500 MG PO TABS: 500.0000 mg | ORAL_TABLET | Freq: Two times a day (BID) | ORAL | 5 refills | Status: DC

## 2019-03-23 NOTE — Progress Notes (Signed)
[name] Jodi Richardson, female DOB:1961-01-14, 58 y.o. [id number]  Chief Complaint  Patient presents with  . Knee Pain    L/dull aching pain, giving way/2 wks ago    HPI  Jodi Richardson is a 58 y.o. female who has developed left knee pain over the last several months getting gradually worse.  She has swelling and popping and most recently feeling the knee may give way. She has no trauma. She had some Naprosyn from a prior different problem and took that and it helped. She has used ice and heat with only slight help.  She has no redness or numbness.   Body mass index is 31.28 kg/m.  ROS  Review of Systems  Constitutional: Positive for activity change.  Genitourinary: Positive for urgency.  Musculoskeletal: Positive for arthralgias, back pain, gait problem, joint swelling and myalgias.  All other systems reviewed and are negative.   All other systems reviewed and are negative.  The following is a summary of the past history medically, past history surgically, known current medicines, social history and family history.  This information is gathered electronically by the computer from prior information and documentation.  I review this each visit and have found including this information at this point in the chart is beneficial and informative.    Past Medical History:  Diagnosis Date  . Hypertension     Past Surgical History:  Procedure Laterality Date  . none      Family History  Problem Relation Age of Onset  . High blood pressure Mother   . High blood pressure Father   . High blood pressure Sister   . Diabetes Sister   . High blood pressure Brother   . Diabetes Brother     Social History Social History   Tobacco Use  . Smoking status: Never Smoker  . Smokeless tobacco: Never Used  Substance Use Topics  . Alcohol use: No  . Drug use: No    No Known Allergies  Current Outpatient Medications  Medication Sig Dispense Refill  . aspirin EC 81 MG  tablet Take 81 mg by mouth daily.    . bimatoprost (LUMIGAN) 0.03 % ophthalmic solution Place 1 drop into both eyes at bedtime.    . cholecalciferol (VITAMIN D) 1000 units tablet Take 1,000 Units by mouth daily.    . cyclobenzaprine (FLEXERIL) 5 MG tablet   0  . dorzolamide-timolol (COSOPT) 22.3-6.8 MG/ML ophthalmic solution Place 1 drop into both eyes 2 (two) times daily.    Marland Kitchen losartan-hydrochlorothiazide (HYZAAR) 50-12.5 MG tablet Take 1 tablet by mouth daily.    . naproxen (NAPROSYN) 500 MG tablet Take 1 tablet (500 mg total) by mouth 2 (two) times daily with a meal. 60 tablet 5  . Omega-3 Fatty Acids (FISH OIL) 1000 MG CAPS Take 1,000 mg by mouth daily.    . rosuvastatin (CRESTOR) 20 MG tablet Take 1 tablet (20 mg total) by mouth daily. 90 tablet 3  . vitamin B-12 (CYANOCOBALAMIN) 1000 MCG tablet Take 1,000 mcg by mouth daily.     No current facility-administered medications for this visit.      Physical Exam  Blood pressure (!) 156/97, pulse 74, temperature 99.1 F (37.3 C), height 5\' 5"  (1.651 m), weight 188 lb (85.3 kg).  Constitutional: overall normal hygiene, normal nutrition, well developed, normal grooming, normal body habitus. Assistive device:none  Musculoskeletal: gait and station Limp left, muscle tone and strength are normal, no tremors or atrophy is present.  .  Neurological:  coordination overall normal.  Deep tendon reflex/nerve stretch intact.  Sensation normal.  Cranial nerves II-XII intact.   Skin:   Normal overall no scars, lesions, ulcers or rashes. No psoriasis.  Psychiatric: Alert and oriented x 3.  Recent memory intact, remote memory unclear.  Normal mood and affect. Well groomed.  Good eye contact.  Cardiovascular: overall no swelling, no varicosities, no edema bilaterally, normal temperatures of the legs and arms, no clubbing, cyanosis and good capillary refill.  Lymphatic: palpation is normal.  Left knee has slight effusion, crepitus, ROM 0 to 110,  medial knee pain, weakly positive medial McMurray, limp left, NV intact.  All other systems reviewed and are negative   The patient has been educated about the nature of the problem(s) and counseled on treatment options.  The patient appeared to understand what I have discussed and is in agreement with it.  X-rays were done of the left knee, reported separately.  She has DJD of the joint, moderate.  Encounter Diagnosis  Name Primary?  . Chronic pain of right knee Yes    PROCEDURE NOTE:  The patient requests injections of the left knee , verbal consent was obtained.  The left knee was prepped appropriately after time out was performed.   Sterile technique was observed and injection of 1 cc of Depo-Medrol 40 mg with several cc's of plain xylocaine. Anesthesia was provided by ethyl chloride and a 20-gauge needle was used to inject the knee area. The injection was tolerated well.  A band aid dressing was applied.  The patient was advised to apply ice later today and tomorrow to the injection sight as needed.   PLAN Call if any problems.  Precautions discussed.  Continue current medications.   Return to clinic 2 weeks   I will call in Naprosyn for her.  Electronically Signed Darreld McleanWayne Klarissa Mcilvain, MD 6/30/20208:43 AM

## 2019-04-06 ENCOUNTER — Ambulatory Visit: Payer: BC Managed Care – PPO | Admitting: Orthopaedic Surgery

## 2019-04-06 ENCOUNTER — Other Ambulatory Visit: Payer: Self-pay

## 2019-04-06 ENCOUNTER — Encounter: Payer: Self-pay | Admitting: Orthopaedic Surgery

## 2019-04-06 VITALS — BP 170/106 | HR 64 | Temp 98.1°F | Ht 65.0 in | Wt 188.2 lb

## 2019-04-06 DIAGNOSIS — G8929 Other chronic pain: Secondary | ICD-10-CM | POA: Diagnosis not present

## 2019-04-06 DIAGNOSIS — M25562 Pain in left knee: Secondary | ICD-10-CM

## 2019-04-06 NOTE — Progress Notes (Signed)
**Note Jodi-Identified via Obfuscation** Patient ZO:XWRUEA:Jodi Richardson Hotz, female DOB:1961-02-01, 58 y.o. VWU:[id number]  Chief Complaint  Patient presents with  . Knee Pain    L/better but hurts mostly at night    HPI  Jodi Richardson is a 58 y.o. female who has left knee pain, more at night.  She has no giving way, no locking.  The injection helped but she still has some swelling.  She has resumed the Naprosyn.  She has no new trauma.   Body mass index is 31.33 kg/m.  ROS  Review of Systems  Constitutional: Positive for activity change.  Genitourinary: Positive for urgency.  Musculoskeletal: Positive for arthralgias, back pain, gait problem, joint swelling and myalgias.  All other systems reviewed and are negative.   All other systems reviewed and are negative.  The following is a summary of the past history medically, past history surgically, known current medicines, social history and family history.  This information is gathered electronically by the computer from prior information and documentation.  I review this each visit and have found including this information at this point in the chart is beneficial and informative.    Past Medical History:  Diagnosis Date  . Hypertension     Past Surgical History:  Procedure Laterality Date  . none      Family History  Problem Relation Age of Onset  . High blood pressure Mother   . High blood pressure Father   . High blood pressure Sister   . Diabetes Sister   . High blood pressure Brother   . Diabetes Brother     Social History Social History   Tobacco Use  . Smoking status: Never Smoker  . Smokeless tobacco: Never Used  Substance Use Topics  . Alcohol use: No  . Drug use: No    No Known Allergies  Current Outpatient Medications  Medication Sig Dispense Refill  . aspirin EC 81 MG tablet Take 81 mg by mouth daily.    . bimatoprost (LUMIGAN) 0.03 % ophthalmic solution Place 1 drop into both eyes at bedtime.    . cholecalciferol (VITAMIN D) 1000 units  tablet Take 1,000 Units by mouth daily.    . cyclobenzaprine (FLEXERIL) 5 MG tablet   0  . dorzolamide-timolol (COSOPT) 22.3-6.8 MG/ML ophthalmic solution Place 1 drop into both eyes 2 (two) times daily.    Marland Kitchen. losartan-hydrochlorothiazide (HYZAAR) 50-12.5 MG tablet Take 1 tablet by mouth daily.    . naproxen (NAPROSYN) 500 MG tablet Take 1 tablet (500 mg total) by mouth 2 (two) times daily with a meal. 60 tablet 5  . Omega-3 Fatty Acids (FISH OIL) 1000 MG CAPS Take 1,000 mg by mouth daily.    . rosuvastatin (CRESTOR) 20 MG tablet Take 1 tablet (20 mg total) by mouth daily. 90 tablet 3  . vitamin B-12 (CYANOCOBALAMIN) 1000 MCG tablet Take 1,000 mcg by mouth daily.     No current facility-administered medications for this visit.      Physical Exam  Blood pressure (!) 170/106, pulse 64, temperature 98.1 F (36.7 C), height 5\' 5"  (1.651 m), weight 188 lb 4 oz (85.4 kg).  Constitutional: overall normal hygiene, normal nutrition, well developed, normal grooming, normal body habitus. Assistive device:none  Musculoskeletal: gait and station Limp left slightly, muscle tone and strength are normal, no tremors or atrophy is present.  .  Neurological: coordination overall normal.  Deep tendon reflex/nerve stretch intact.  Sensation normal.  Cranial nerves II-XII intact.   Skin:   Normal overall no  scars, lesions, ulcers or rashes. No psoriasis.  Psychiatric: Alert and oriented x 3.  Recent memory intact, remote memory unclear.  Normal mood and affect. Well groomed.  Good eye contact.  Cardiovascular: overall no swelling, no varicosities, no edema bilaterally, normal temperatures of the legs and arms, no clubbing, cyanosis and good capillary refill.  Lymphatic: palpation is normal.  Left knee has slight effusion, ROM 0 to 110, slight limp to the left, NV intact.  Knee is stable.  All other systems reviewed and are negative   The patient has been educated about the nature of the problem(s) and  counseled on treatment options.  The patient appeared to understand what I have discussed and is in agreement with it.  Encounter Diagnosis  Name Primary?  . Chronic pain of left knee Yes    PLAN Call if any problems.  Precautions discussed.  Continue current medications.   Return to clinic 1 month  Electronically Signed Sanjuana Kava, MD 7/14/202011:16 AM

## 2019-04-20 ENCOUNTER — Telehealth: Payer: Self-pay | Admitting: Orthopaedic Surgery

## 2019-04-20 NOTE — Telephone Encounter (Signed)
I can see her Thursday for injection if she wants

## 2019-04-20 NOTE — Telephone Encounter (Signed)
Patient called, said that her knee is still causing some pain; states she is using the Naprosyn, and is aware of her follow up appointment 05/04/19. She is asking, since she did not "take an injection" at her last visit, if there is any recommendation, or if she should come in prior to 05/04/19, which we offered.  Please advise.

## 2019-04-21 NOTE — Telephone Encounter (Signed)
Called back to patient per Dr Brooke Bonito response; left voice message.

## 2019-04-22 NOTE — Telephone Encounter (Signed)
Tried back today to offer per Dr Brooke Bonito response. Unable to reach. Left message.

## 2019-05-04 ENCOUNTER — Ambulatory Visit: Payer: BC Managed Care – PPO | Admitting: Orthopaedic Surgery

## 2019-05-06 DIAGNOSIS — Z1231 Encounter for screening mammogram for malignant neoplasm of breast: Secondary | ICD-10-CM | POA: Diagnosis not present

## 2019-05-17 DIAGNOSIS — Z0001 Encounter for general adult medical examination with abnormal findings: Secondary | ICD-10-CM | POA: Diagnosis not present

## 2019-05-17 DIAGNOSIS — I10 Essential (primary) hypertension: Secondary | ICD-10-CM | POA: Diagnosis not present

## 2019-05-17 DIAGNOSIS — Z681 Body mass index (BMI) 19 or less, adult: Secondary | ICD-10-CM | POA: Diagnosis not present

## 2019-05-17 DIAGNOSIS — Z1389 Encounter for screening for other disorder: Secondary | ICD-10-CM | POA: Diagnosis not present

## 2019-05-17 DIAGNOSIS — G4700 Insomnia, unspecified: Secondary | ICD-10-CM | POA: Diagnosis not present

## 2019-05-17 DIAGNOSIS — E7849 Other hyperlipidemia: Secondary | ICD-10-CM | POA: Diagnosis not present

## 2019-05-18 ENCOUNTER — Ambulatory Visit: Payer: BC Managed Care – PPO | Admitting: Orthopaedic Surgery

## 2019-05-27 ENCOUNTER — Ambulatory Visit: Payer: BC Managed Care – PPO | Admitting: Orthopaedic Surgery

## 2019-05-28 DIAGNOSIS — Z1389 Encounter for screening for other disorder: Secondary | ICD-10-CM | POA: Diagnosis not present

## 2019-05-28 DIAGNOSIS — Z681 Body mass index (BMI) 19 or less, adult: Secondary | ICD-10-CM | POA: Diagnosis not present

## 2019-05-29 DIAGNOSIS — Z1211 Encounter for screening for malignant neoplasm of colon: Secondary | ICD-10-CM | POA: Diagnosis not present

## 2019-06-01 ENCOUNTER — Other Ambulatory Visit: Payer: Self-pay

## 2019-06-01 ENCOUNTER — Ambulatory Visit: Payer: BC Managed Care – PPO | Admitting: Orthopaedic Surgery

## 2019-06-01 ENCOUNTER — Encounter: Payer: Self-pay | Admitting: Orthopaedic Surgery

## 2019-06-01 VITALS — BP 133/79 | HR 68 | Temp 97.7°F | Ht 65.0 in | Wt 185.0 lb

## 2019-06-01 DIAGNOSIS — G8929 Other chronic pain: Secondary | ICD-10-CM | POA: Diagnosis not present

## 2019-06-01 DIAGNOSIS — M25562 Pain in left knee: Secondary | ICD-10-CM

## 2019-06-01 NOTE — Progress Notes (Signed)
CC:  I am much better  Her left knee is not hurting now. She has some swelling at times but overall much improved.  Encounter Diagnosis  Name Primary?  . Chronic pain of left knee Yes   I will see as needed.  Electronically Signed Sanjuana Kava, MD 9/8/202011:54 AM

## 2020-03-14 ENCOUNTER — Other Ambulatory Visit: Payer: Self-pay | Admitting: Orthopaedic Surgery

## 2020-03-23 DIAGNOSIS — N390 Urinary tract infection, site not specified: Secondary | ICD-10-CM | POA: Diagnosis not present

## 2020-04-13 DIAGNOSIS — H524 Presbyopia: Secondary | ICD-10-CM | POA: Diagnosis not present

## 2020-04-13 DIAGNOSIS — H401232 Low-tension glaucoma, bilateral, moderate stage: Secondary | ICD-10-CM | POA: Diagnosis not present

## 2020-04-25 DIAGNOSIS — H401231 Low-tension glaucoma, bilateral, mild stage: Secondary | ICD-10-CM | POA: Diagnosis not present

## 2020-06-02 DIAGNOSIS — Z1389 Encounter for screening for other disorder: Secondary | ICD-10-CM | POA: Diagnosis not present

## 2020-06-02 DIAGNOSIS — M353 Polymyalgia rheumatica: Secondary | ICD-10-CM | POA: Diagnosis not present

## 2020-06-02 DIAGNOSIS — R5383 Other fatigue: Secondary | ICD-10-CM | POA: Diagnosis not present

## 2020-06-02 DIAGNOSIS — I10 Essential (primary) hypertension: Secondary | ICD-10-CM | POA: Diagnosis not present

## 2020-06-02 DIAGNOSIS — E7849 Other hyperlipidemia: Secondary | ICD-10-CM | POA: Diagnosis not present

## 2020-06-02 DIAGNOSIS — Z6829 Body mass index (BMI) 29.0-29.9, adult: Secondary | ICD-10-CM | POA: Diagnosis not present

## 2020-06-02 DIAGNOSIS — G4700 Insomnia, unspecified: Secondary | ICD-10-CM | POA: Diagnosis not present

## 2020-06-02 DIAGNOSIS — Z0001 Encounter for general adult medical examination with abnormal findings: Secondary | ICD-10-CM | POA: Diagnosis not present

## 2020-06-05 DIAGNOSIS — R5383 Other fatigue: Secondary | ICD-10-CM | POA: Diagnosis not present

## 2020-06-05 DIAGNOSIS — E559 Vitamin D deficiency, unspecified: Secondary | ICD-10-CM | POA: Diagnosis not present

## 2020-06-05 DIAGNOSIS — Z1389 Encounter for screening for other disorder: Secondary | ICD-10-CM | POA: Diagnosis not present

## 2020-06-05 DIAGNOSIS — Z0001 Encounter for general adult medical examination with abnormal findings: Secondary | ICD-10-CM | POA: Diagnosis not present

## 2020-07-06 DIAGNOSIS — H401231 Low-tension glaucoma, bilateral, mild stage: Secondary | ICD-10-CM | POA: Diagnosis not present

## 2020-07-15 ENCOUNTER — Other Ambulatory Visit: Payer: Self-pay

## 2020-07-15 ENCOUNTER — Ambulatory Visit
Admission: EM | Admit: 2020-07-15 | Discharge: 2020-07-15 | Disposition: A | Discharge status: Home / Self Care | Payer: BLUE CROSS/BLUE SHIELD | Attending: Emergency Medicine | Admitting: Emergency Medicine

## 2020-07-15 ENCOUNTER — Encounter: Payer: Self-pay | Admitting: Emergency Medicine

## 2020-07-15 DIAGNOSIS — M545 Low back pain, unspecified: Secondary | ICD-10-CM

## 2020-07-15 HISTORY — DX: Unspecified glaucoma: H40.9

## 2020-07-15 HISTORY — DX: Pure hypercholesterolemia, unspecified: E78.00

## 2020-07-15 LAB — POCT URINALYSIS DIP (MANUAL ENTRY)
Bilirubin, UA: NEGATIVE
Blood, UA: NEGATIVE
Glucose, UA: NEGATIVE mg/dL
Ketones, POC UA: NEGATIVE mg/dL
Leukocytes, UA: NEGATIVE
Nitrite, UA: NEGATIVE
Protein Ur, POC: NEGATIVE mg/dL
Spec Grav, UA: <=1.005 — AB (ref 1.010–1.025)
Urobilinogen, UA: 0.2 E.U./dL
pH, UA: 6 (ref 5.0–8.0)

## 2020-07-15 MED ORDER — PREDNISONE 10 MG PO TABS: 20.0000 mg | ORAL_TABLET | Freq: Every day | ORAL | 0 refills | Status: DC

## 2020-07-15 MED ORDER — CYCLOBENZAPRINE HCL 5 MG PO TABS: 5.0000 mg | ORAL_TABLET | Freq: Three times a day (TID) | ORAL | 0 refills | Status: DC | PRN

## 2020-07-15 NOTE — Discharge Instructions (Addendum)
POCT urine analysis was negative for UTI.  Sample will be sent for culture and someone will call if your result is abnormal. Rest, ice and heat as needed Ensure adequate ROM as tolerated. Continue to take naproxen as prescribed  prescribed flexeril  for muscle spasm.  Do not drive or operate heavy machinery while taking this medication Return here or go to ER if you have any new or worsening symptoms such as numbness/tingling of the inner thighs, loss of bladder or bowel control, headache/blurry vision, nausea/vomiting, confusion/altered mental status, dizziness, weakness, passing out, imbalance, etc..

## 2020-07-15 NOTE — ED Triage Notes (Signed)
Lower LT back pain since Wednesday. Denies any injury.  States its worse when she lays down for a while and gets ups.  States she has same pain on the RT lower back in the past and it was UTI.

## 2020-07-15 NOTE — ED Provider Notes (Signed)
[facility]   [id number] 07/15/20 Arrival Time: 0846  Chief Complaint  Patient presents with   Back Pain     SUBJECTIVE: History from: patient.  Jodi Richardson is a 59 y.o. female who presented to the urgent care for complaint of left lower back pain for the past 3 days.  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  She localizes the pain to the left lower back.  She describes the pain as constant and achy.  She has tried OTC medications without relief.  Her symptoms are made worse with ROM.  She denies similar symptoms in the past.  Denies chills, fever, nausea, vomiting, diarrhea  ROS: As per HPI.  All other pertinent ROS negative.     Past Medical History:  Diagnosis Date   Glaucoma    High cholesterol    Hypertension    Past Surgical History:  Procedure Laterality Date   none     No Known Allergies No current facility-administered medications on file prior to encounter.   Current Outpatient Medications on File Prior to Encounter  Medication Sig Dispense Refill   aspirin EC 81 MG tablet Take 81 mg by mouth daily.     bimatoprost (LUMIGAN) 0.03 % ophthalmic solution Place 1 drop into both eyes at bedtime.     cholecalciferol (VITAMIN D) 1000 units tablet Take 1,000 Units by mouth daily.     dorzolamide-timolol (COSOPT) 22.3-6.8 MG/ML ophthalmic solution Place 1 drop into both eyes 2 (two) times daily.     losartan-hydrochlorothiazide (HYZAAR) 50-12.5 MG tablet Take 1 tablet by mouth daily.     naproxen (NAPROSYN) 500 MG tablet TAKE 1 TABLET BY MOUTH TWICE DAILY WITH A MEAL 60 tablet 0   Omega-3 Fatty Acids (FISH OIL) 1000 MG CAPS Take 1,000 mg by mouth daily.     rosuvastatin (CRESTOR) 20 MG tablet Take 1 tablet (20 mg total) by mouth daily. 90 tablet 3   vitamin B-12 (CYANOCOBALAMIN) 1000 MCG tablet Take 1,000 mcg by mouth daily.     Social History   Socioeconomic  History   Marital status: Married    Spouse name: Not on file   Number of children: Not on file   Years of education: Not on file   Highest education level: Not on file  Occupational History   Not on file  Tobacco Use   Smoking status: Never Smoker   Smokeless tobacco: Never Used  Substance and Sexual Activity   Alcohol use: No   Drug use: No   Sexual activity: Not on file  Other Topics Concern   Not on file  Social History Narrative   Not on file   Social Determinants of Health   Financial Resource Strain:    Difficulty of Paying Living Expenses: Not on file  Food Insecurity:    Worried About Running Out of Food in the Last Year: Not on file   Ran Out of Food in the Last Year: Not on file  Transportation Needs:    Lack of Transportation (Medical): Not on file   Lack of Transportation (Non-Medical): Not on file  Physical Activity:    Days of Exercise per Week: Not on file   Minutes of Exercise per Session: Not on file  Stress:    Feeling of Stress : Not on file  Social Connections:    Frequency of Communication with Friends and Family: Not on file  Frequency of Social Gatherings with Friends and Family: Not on file   Attends Religious Services: Not on file   Active Member of Clubs or Organizations: Not on file   Attends Banker Meetings: Not on file   Marital Status: Not on file  Intimate Partner Violence:    Fear of Current or Ex-Partner: Not on file   Emotionally Abused: Not on file   Physically Abused: Not on file   Sexually Abused: Not on file   Family History  Problem Relation Age of Onset   High blood pressure Mother    High blood pressure Father    High blood pressure Sister    Diabetes Sister    High blood pressure Brother    Diabetes Brother     OBJECTIVE:  Vitals:   07/15/20 0909 07/15/20 0910  BP: (!) 157/91   Pulse: 80   Resp: 17   Temp: 98.7 F (37.1 C)   TempSrc: Oral   SpO2: 97%     Weight:  190 lb (86.2 kg)  Height:  5\' 5"  (1.651 m)     Physical Exam Vitals and nursing note reviewed.  Constitutional:      General: She is not in acute distress.    Appearance: Normal appearance. She is normal weight. She is not ill-appearing, toxic-appearing or diaphoretic.  HENT:     Head: Normocephalic.  Cardiovascular:     Rate and Rhythm: Normal rate and regular rhythm.     Pulses: Normal pulses.     Heart sounds: Normal heart sounds. No murmur heard.  No friction rub. No gallop.   Pulmonary:     Effort: Pulmonary effort is normal. No respiratory distress.     Breath sounds: Normal breath sounds. No stridor. No wheezing, rhonchi or rales.  Chest:     Chest wall: No tenderness.  Musculoskeletal:        General: Tenderness present.     Lumbar back: Spasms and tenderness present.     Comments: Back:  Patient ambulates from chair to exam table without difficulty.  Inspection: Skin clear and intact without obvious swelling, erythema, or ecchymosis. Warm to the touch  Palpation: Vertebral processes nontender. Tenderness about the lower left paravertebral muscles  ROM: FROM Strength: 5/5 hip flexion, 5/5 knee extension, 5/5 knee flexion, 5/5 plantar flexion, 5/5 dorsiflexion    Neurological:     Mental Status: She is alert and oriented to person, place, and time.     LABS:  Results for orders placed or performed during the hospital encounter of 07/15/20 (from the past 24 hour(s))  POCT urinalysis dipstick     Status: Abnormal   Collection Time: 07/15/20  9:22 AM  Result Value Ref Range   Color, UA yellow yellow   Clarity, UA clear clear   Glucose, UA negative negative mg/dL   Bilirubin, UA negative negative   Ketones, POC UA negative negative mg/dL   Spec Grav, UA 07/17/20 (A) 1.010 - 1.025   Blood, UA negative negative   pH, UA 6.0 5.0 - 8.0   Protein Ur, POC negative negative mg/dL   Urobilinogen, UA 0.2 0.2 or 1.0 E.U./dL   Nitrite, UA Negative Negative    Leukocytes, UA Negative Negative     ASSESSMENT & PLAN:  1. Acute left-sided low back pain without sciatica     Meds ordered this encounter  Medications   cyclobenzaprine (FLEXERIL) 5 MG tablet    Sig: Take 1 tablet (5 mg total) by mouth 3 (  three) times daily as needed.    Dispense:  30 tablet    Refill:  0   predniSONE (DELTASONE) 10 MG tablet    Sig: Take 2 tablets (20 mg total) by mouth daily.    Dispense:  15 tablet    Refill:  0    Discharge instructions   POCT urine analysis was negative for UTI.  Sample will be sent for culture and someone will call if your result is abnormal. Rest, ice and heat as needed Ensure adequate ROM as tolerated. Continue to take naproxen as prescribed  prescribed flexeril  for muscle spasm.  Do not drive or operate heavy machinery while taking this medication Return here or go to ER if you have any new or worsening symptoms such as numbness/tingling of the inner thighs, loss of bladder or bowel control, headache/blurry vision, nausea/vomiting, confusion/altered mental status, dizziness, weakness, passing out, imbalance, etc...    reviewed expectations re: course of current medical issues. Questions answered. Outlined signs and symptoms indicating need for more acute intervention. Patient verbalized understanding. After Visit Summary given.         Durward Parcel, FNP 07/15/20 [id number]

## 2020-07-16 LAB — URINE CULTURE
Culture: NO GROWTH
Special Requests: NORMAL

## 2020-08-12 ENCOUNTER — Other Ambulatory Visit: Payer: Self-pay | Admitting: Orthopaedic Surgery

## 2020-08-14 ENCOUNTER — Other Ambulatory Visit: Payer: Self-pay

## 2020-08-14 ENCOUNTER — Emergency Department (HOSPITAL_COMMUNITY)
Admission: EM | Admit: 2020-08-14 | Discharge: 2020-08-15 | Disposition: A | Discharge status: Home / Self Care | Payer: BLUE CROSS/BLUE SHIELD | Attending: Emergency Medicine | Admitting: Emergency Medicine

## 2020-08-14 ENCOUNTER — Emergency Department (HOSPITAL_COMMUNITY): Payer: BLUE CROSS/BLUE SHIELD

## 2020-08-14 ENCOUNTER — Encounter (HOSPITAL_COMMUNITY): Payer: Self-pay

## 2020-08-14 DIAGNOSIS — I10 Essential (primary) hypertension: Secondary | ICD-10-CM | POA: Diagnosis not present

## 2020-08-14 DIAGNOSIS — Z7982 Long term (current) use of aspirin: Secondary | ICD-10-CM | POA: Diagnosis not present

## 2020-08-14 DIAGNOSIS — I471 Supraventricular tachycardia, unspecified: Secondary | ICD-10-CM

## 2020-08-14 DIAGNOSIS — R002 Palpitations: Secondary | ICD-10-CM | POA: Diagnosis not present

## 2020-08-14 DIAGNOSIS — Z79899 Other long term (current) drug therapy: Secondary | ICD-10-CM | POA: Insufficient documentation

## 2020-08-14 DIAGNOSIS — R079 Chest pain, unspecified: Secondary | ICD-10-CM | POA: Diagnosis not present

## 2020-08-14 LAB — TSH: TSH: 5.154 u[IU]/mL — ABNORMAL HIGH (ref 0.350–4.500)

## 2020-08-14 LAB — BASIC METABOLIC PANEL
Anion gap: 8 (ref 5–15)
BUN: 11 mg/dL (ref 6–20)
CO2: 25 mmol/L (ref 22–32)
Calcium: 9.6 mg/dL (ref 8.9–10.3)
Chloride: 109 mmol/L (ref 98–111)
Creatinine, Ser: 0.96 mg/dL (ref 0.44–1.00)
GFR, Estimated: >60 mL/min (ref >60)
Glucose, Bld: 123 mg/dL — ABNORMAL HIGH (ref 70–99)
Potassium: 3.7 mmol/L (ref 3.5–5.1)
Sodium: 142 mmol/L (ref 135–145)

## 2020-08-14 LAB — CBC WITH DIFFERENTIAL/PLATELET
Abs Immature Granulocytes: 0.01 10*3/uL (ref 0.00–0.07)
Basophils Absolute: 0.1 10*3/uL (ref 0.0–0.1)
Basophils Relative: 1 %
Eosinophils Absolute: 0.2 10*3/uL (ref 0.0–0.5)
Eosinophils Relative: 2 %
HCT: 42.1 % (ref 36.0–46.0)
Hemoglobin: 13 g/dL (ref 12.0–15.0)
Immature Granulocytes: 0 %
Lymphocytes Relative: 61 %
Lymphs Abs: 5 10*3/uL — ABNORMAL HIGH (ref 0.7–4.0)
MCH: 27.7 pg (ref 26.0–34.0)
MCHC: 30.9 g/dL (ref 30.0–36.0)
MCV: 89.6 fL (ref 80.0–100.0)
Monocytes Absolute: 0.9 10*3/uL (ref 0.1–1.0)
Monocytes Relative: 11 %
Neutro Abs: 2 10*3/uL (ref 1.7–7.7)
Neutrophils Relative %: 25 %
Platelets: 336 10*3/uL (ref 150–400)
RBC: 4.7 MIL/uL (ref 3.87–5.11)
RDW: 14.6 % (ref 11.5–15.5)
WBC: 8.1 10*3/uL (ref 4.0–10.5)
nRBC: 0 % (ref 0.0–0.2)

## 2020-08-14 MED ORDER — ADENOSINE 6 MG/2ML IV SOLN
INTRAVENOUS | Status: AC
  Administered 2020-08-14: 6 mg
  Filled 2020-08-14: qty 6

## 2020-08-14 MED ORDER — SODIUM CHLORIDE 0.9 % IV SOLN: INTRAVENOUS | Status: DC

## 2020-08-14 MED ORDER — SODIUM CHLORIDE 0.9 % IV BOLUS
500.0000 mL | Freq: Once | INTRAVENOUS | Status: AC
  Administered 2020-08-14: 500 mL via INTRAVENOUS

## 2020-08-14 MED ORDER — ADENOSINE 6 MG/2ML IV SOLN
INTRAVENOUS | Status: AC | PRN
  Administered 2020-08-14: 12 mg via INTRAVENOUS

## 2020-08-14 NOTE — Discharge Instructions (Addendum)
Follow-up with cardiology for the supraventricular tachycardia.  If your heart starts going fast again and stays that way for 40 minutes return.  Also your thyroid-stimulating hormone was elevated which may mean your thyroid may be underactive.  Follow-up with your primary care doctor regarding that.  Avoid caffeine.

## 2020-08-14 NOTE — ED Triage Notes (Signed)
Pt presents to ED with complaints of heart racing and "beating funny." Started about 1.5 hours ago.

## 2020-08-14 NOTE — Telephone Encounter (Signed)
Dr Hilda Lias out of office, will you refill the Naproxen for this patient?

## 2020-08-14 NOTE — ED Notes (Signed)
MD at bedside. 

## 2020-08-14 NOTE — ED Provider Notes (Signed)
[facility] EMERGENCY DEPARTMENT Provider Note   CSN: [id number] Arrival date & time: 08/14/20  1910     History Chief Complaint  Patient presents with  . Palpitations    Jodi Richardson is a 59 y.o. female.  Patient presenting with onset of rapid heart rate starting at 6 PM.  It has been persistent.  Associated with a funny beating feeling no real chest pain but a little bit of shortness of breath.  Patient is never had anything like this before.  No nausea vomiting no fevers no cough no abdominal pain.  No leg swelling.  Past medical history significant for hypertension and high cholesterol.  Patient is on Hyzaar for high blood pressure.        Past Medical History:  Diagnosis Date  . Glaucoma   . High cholesterol   . Hypertension     There are no problems to display for this patient.   Past Surgical History:  Procedure Laterality Date  . none       OB History   No obstetric history on file.     Family History  Problem Relation Age of Onset  . High blood pressure Mother   . High blood pressure Father   . High blood pressure Sister   . Diabetes Sister   . High blood pressure Brother   . Diabetes Brother     Social History   Tobacco Use  . Smoking status: Never Smoker  . Smokeless tobacco: Never Used  Substance Use Topics  . Alcohol use: No  . Drug use: No    Home Medications Prior to Admission medications   Medication Sig Start Date End Date Taking? Authorizing Provider  aspirin EC 81 MG tablet Take 81 mg by mouth daily.   Yes [provider]  cholecalciferol (VITAMIN D) 1000 units tablet Take 1,000 Units by mouth daily.   Yes [provider]  dorzolamide-timolol (COSOPT) 22.3-6.8 MG/ML ophthalmic solution Place 1 drop into both eyes 2 (two) times daily.   Yes [provider]  losartan-hydrochlorothiazide (HYZAAR) 50-12.5 MG tablet Take 1 tablet by mouth daily.   Yes [provider]  Multiple Vitamin  (MULTIVITAMIN) tablet Take 1 tablet by mouth daily.   Yes [provider]  vitamin B-12 (CYANOCOBALAMIN) 1000 MCG tablet Take 1,000 mcg by mouth daily.   Yes [provider]  bimatoprost (LUMIGAN) 0.03 % ophthalmic solution Place 1 drop into both eyes at bedtime. Patient not taking: Reported on 08/14/2020    [provider]  cyclobenzaprine (FLEXERIL) 5 MG tablet Take 1 tablet (5 mg total) by mouth 3 (three) times daily as needed. Patient not taking: Reported on 08/14/2020 07/15/20   Durward Parcel, FNP  naproxen (NAPROSYN) 500 MG tablet TAKE 1 TABLET BY MOUTH TWICE DAILY WITH A MEAL 08/14/20   Oliver Barre, MD  Omega-3 Fatty Acids (FISH OIL) 1000 MG CAPS Take 1,000 mg by mouth daily. Patient not taking: Reported on 08/14/2020    [provider]  predniSONE (DELTASONE) 10 MG tablet Take 2 tablets (20 mg total) by mouth daily. Patient not taking: Reported on 08/14/2020 07/15/20   Durward Parcel, FNP  rosuvastatin (CRESTOR) 20 MG tablet Take 1 tablet (20 mg total) by mouth daily. Patient not taking: Reported on 08/14/2020 05/24/16   Pricilla Riffle, MD    Allergies    Patient has no known allergies.  Review of Systems   Review of Systems  Constitutional: Negative for chills and  fever.  HENT: Negative for congestion, rhinorrhea and sore throat.   Eyes: Negative for visual disturbance.  Respiratory: Positive for shortness of breath. Negative for cough.   Cardiovascular: Positive for chest pain. Negative for leg swelling.  Gastrointestinal: Negative for abdominal pain, diarrhea, nausea and vomiting.  Genitourinary: Negative for dysuria.  Musculoskeletal: Negative for back pain and neck pain.  Skin: Negative for rash.  Neurological: Negative for dizziness, light-headedness and headaches.  Hematological: Does not bruise/bleed easily.  Psychiatric/Behavioral: Negative for confusion.    Physical Exam Updated Vital Signs BP (!) 147/90   Pulse 91    Temp 97.8 F (36.6 C) (Oral)   Resp 18   Ht 1.651 m (5\' 5" )   Wt 86.2 kg   SpO2 100%   BMI 31.62 kg/m   Physical Exam Vitals and nursing note reviewed.  Constitutional:      General: She is in acute distress.     Appearance: Normal appearance. She is well-developed.  HENT:     Head: Normocephalic and atraumatic.  Eyes:     Extraocular Movements: Extraocular movements intact.     Conjunctiva/sclera: Conjunctivae normal.     Pupils: Pupils are equal, round, and reactive to light.  Cardiovascular:     Rate and Rhythm: Regular rhythm. Tachycardia present.     Heart sounds: No murmur heard.   Pulmonary:     Effort: Pulmonary effort is normal. No respiratory distress.     Breath sounds: Normal breath sounds. No wheezing, rhonchi or rales.  Abdominal:     Palpations: Abdomen is soft.     Tenderness: There is no abdominal tenderness.  Musculoskeletal:        General: No swelling. Normal range of motion.     Cervical back: Neck supple.  Skin:    General: Skin is warm and dry.     Capillary Refill: Capillary refill takes less than 2 seconds.  Neurological:     General: No focal deficit present.     Mental Status: She is alert and oriented to person, place, and time.     Cranial Nerves: No cranial nerve deficit.     Sensory: No sensory deficit.     Motor: No weakness.     ED Results / Procedures / Treatments   Labs (all labs ordered are listed, but only abnormal results are displayed) Labs Reviewed  CBC WITH DIFFERENTIAL/PLATELET - Abnormal; Notable for the following components:      Result Value   Lymphs Abs 5.0 (*)    All other components within normal limits  BASIC METABOLIC PANEL - Abnormal; Notable for the following components:   Glucose, Bld 123 (*)    All other components within normal limits  TSH - Abnormal; Notable for the following components:   TSH 5.154 (*)    All other components within normal limits    EKG EKG Interpretation  Date/Time:  Monday  August 14 2020 19:46:59 EST Ventricular Rate:  126 PR Interval:    QRS Duration: 71 QT Interval:  313 QTC Calculation: 454 R Axis:   51 Text Interpretation: Sinus tachycardia Consider right atrial enlargement ST depression, consider ischemia, lateral lds SVT corrected Confirmed by 07-[birth year]-1976 [phone]) on 08/14/2020 11:26:00 PM   Radiology DG Chest Port 1 View  Result Date: 08/14/2020 CLINICAL DATA:  Palpitations.  History of hypertension. EXAM: PORTABLE CHEST 1 VIEW COMPARISON:  Prior chest radiographs 02/03/2015. FINDINGS: Cardiac monitors overlie the chest. Heart size within normal limits. No appreciable airspace consolidation or pulmonary  edema. No evidence of pleural effusion or pneumothorax. No acute bony abnormality identified. IMPRESSION: No evidence of acute cardiopulmonary abnormality. Electronically Signed   By: Jackey Loge DO   On: 08/14/2020 20:58    Procedures Procedures (including critical care time) CRITICAL CARE Performed by: Vanetta Mulders Total critical care time: 60 minutes Critical care time was exclusive of separately billable procedures and treating other patients. Critical care was necessary to treat or prevent imminent or life-threatening deterioration. Critical care was time spent personally by me on the following activities: development of treatment plan with patient and/or surrogate as well as nursing, discussions with consultants, evaluation of patient's response to treatment, examination of patient, obtaining history from patient or surrogate, ordering and performing treatments and interventions, ordering and review of laboratory studies, ordering and review of radiographic studies, pulse oximetry and re-evaluation of patient's condition.  Patient presenting with supraventricular tachycardia.  Patient placed on monitors.  Cardiac defibrillator placed patient placed on 2 L of oxygen.  IV proximal antecubital established.  Patient received 6 mg of  adenosine.  Which blocked her heart but then only briefly and then she went back into the supraventricular tachycardia with heart rate up in the 170s.  Patient given 12 mg better block clearly sinus type rhythm is a started back up.  And this time she did convert heart rate went up to 120s but it was sinus tachycardia.  Patient felt much better.  Patient tolerated the chemical cardioversion without any difficulties.  Medications Ordered in ED Medications  0.9 %  sodium chloride infusion ( Intravenous New Bag/Given 08/14/20 2011)  adenosine (ADENOCARD) 6 MG/2ML injection (6 mg  Given 08/14/20 1940)  adenosine (ADENOCARD) 6 MG/2ML injection (12 mg Intravenous Given 08/14/20 1945)  sodium chloride 0.9 % bolus 500 mL (0 mLs Intravenous Stopped 08/14/20 2028)    ED Course  I have reviewed the triage vital signs and the nursing notes.  Pertinent labs & imaging results that were available during my care of the patient were reviewed by me and considered in my medical decision making (see chart for details).    MDM Rules/Calculators/A&P                         Patient converted with 12 mg of adenosine to a sinus rhythm.  Patient's labs chest x-ray without any acute findings.  Patient is remained in sinus rhythm.  We will have patient follow-up with cardiology.  Patient's thyroid stimulating hormone was elevated which could mean some hypothyroidism.  Certainly would necessarily be the cause of the SVT.  Patient is heavy on caffeine drinks.  Recommended that she stop that we will have her follow-up with cardiology follow-up with her primary care doctor regarding the TSH.  She will return for rapid heart rate later this time 40 minutes or longer  Final Clinical Impression(s) / ED Diagnoses Final diagnoses:  SVT (supraventricular tachycardia) (HCC)    Rx / DC Orders ED Discharge Orders    None       Vanetta Mulders, MD 08/14/20 2332

## 2020-08-21 NOTE — Progress Notes (Signed)
Cardiology Office Note  Date: 08/22/2020   ID: JARITA Richardson, DOB 1961-05-06, MRN [id number]  PCP:  Elfredia Nevins, MD  Cardiologist:  No primary care provider on file. Electrophysiologist:  None   Chief Complaint: Follow up hospital visit SVT  History of Present Illness: Jodi Richardson is a 59 y.o. female with a history of SVT, HTN, HLD.  Previously seen by Dr. Tenny Craw  August 2017.  She was referred for hyperlipidemia.  She had been seen by Dr. Carlena Sax who heard a soft cardiac murmur.  Echocardiogram was ordered for systolic murmur.  Lab work was good be obtained due to to profound LDL over 200.  Dr. Tenny Craw stated she would probably need statin medication.  She was given a referral for sports medicine due to foot pain.  Recent presentation to the emergency room on 08/15/2020 with complaint of a rapid heart rate which was persistent.  Described as a funny beating feeling no real chest pain below shortness of breath.  Patient described as never having anything like this previously.  TSH was elevated at 5.154.  Basic metabolic panel was normal except for elevated glucose of 123.  CBC was normal.  She received an initial dose of adenosine 6 mg IV and a subsequent 12 mg dose which converted her back to normal sinus rhythm which eventually went up to the 120s but was sinus tachycardia.  Patient felt much better and tolerated the chemical cardioversion without any difficulty.  Chest x-ray showed no acute findings.  She reported  heavy caffeine use and it was recommended that she stop caffeine.  She presents today for follow-up.  States she has not had any more palpitations since the event on 08/15/2020.  States she has significantly reduce caffeine intake and drinking decaf coffee and noncaffeinated sodas.  Her TSH was elevated at 5.14 in the emergency room.  She has had no further lab work-up since that time.  She has an upcoming appointment with her primary care provider.  She states leading up to  the palpitation episodes which caused her to present to the emergency room she had experienced several episodes leading up to that which were short-lived and nonsustained.  She states sometimes she would cough and the palpitations would resolve.  She denies any anginal or exertional symptoms, palpitations or arrhythmias, orthostatic symptoms, CVA or TIA-like symptoms, PND, orthopnea, claudication-like symptoms, DVT or PE-like symptoms, or lower extremity edema.  Past Medical History:  Diagnosis Date  . Glaucoma   . High cholesterol   . Hypertension     Past Surgical History:  Procedure Laterality Date  . none      Current Outpatient Medications  Medication Sig Dispense Refill  . aspirin EC 81 MG tablet Take 81 mg by mouth daily.    . cholecalciferol (VITAMIN D) 1000 units tablet Take 1,000 Units by mouth daily.    Marland Kitchen latanoprost (XALATAN) 0.005 % ophthalmic solution Place 1 drop into both eyes daily.    Marland Kitchen losartan-hydrochlorothiazide (HYZAAR) 50-12.5 MG tablet Take 1 tablet by mouth daily.    . Multiple Vitamin (MULTIVITAMIN) tablet Take 1 tablet by mouth daily.    . naproxen (NAPROSYN) 500 MG tablet TAKE 1 TABLET BY MOUTH TWICE DAILY WITH A MEAL 60 tablet 0  . vitamin B-12 (CYANOCOBALAMIN) 1000 MCG tablet Take 1,000 mcg by mouth daily.    Marland Kitchen ezetimibe (ZETIA) 10 MG tablet Take 10 mg by mouth daily. (Patient not taking: Reported on 08/22/2020)     No  current facility-administered medications for this visit.   Allergies:  Patient has no known allergies.   Social History: The patient  reports that she has never smoked. She has never used smokeless tobacco. She reports that she does not drink alcohol and does not use drugs.   Family History: The patient's family history includes Diabetes in her brother and sister; High blood pressure in her brother, father, mother, and sister.   ROS:  Please see the history of present illness. Otherwise, complete review of systems is positive for none.   All other systems are reviewed and negative.   Physical Exam: VS:  BP (!) 144/90   Pulse 72   Ht 5\' 5"  (1.651 m)   Wt 187 lb 9.6 oz (85.1 kg)   BMI 31.22 kg/m , BMI Body mass index is 31.22 kg/m.  Wt Readings from Last 3 Encounters:  08/22/20 187 lb 9.6 oz (85.1 kg)  08/14/20 190 lb (86.2 kg)  07/15/20 190 lb (86.2 kg)    General: Patient appears comfortable at rest. Neck: Supple, no elevated JVP or carotid bruits, no thyromegaly. Lungs: Clear to auscultation, nonlabored breathing at rest. Cardiac: Regular rate and rhythm, no S3 or significant systolic murmur, no pericardial rub. Extremities: No pitting edema, distal pulses 2+. Skin: Warm and dry. Musculoskeletal: No kyphosis. Neuropsychiatric: Alert and oriented x3, affect grossly appropriate.  ECG:  EKG after cardioversion for SVT with adenosine on August 14, 2020 sinus tachycardia with a rate of 126.  Recent Labwork: 08/14/2020: BUN 11; Creatinine, Ser 0.96; Hemoglobin 13.0; Platelets 336; Potassium 3.7; Sodium 142; TSH 5.154     Component Value Date/Time   CHOL 197 08/14/2016 1010   TRIG 96 08/14/2016 1010   HDL 66 08/14/2016 1010   CHOLHDL 3.0 08/14/2016 1010   VLDL 19 08/14/2016 1010   LDLCALC 112 (H) 08/14/2016 1010    Other Studies Reviewed Today:  Echocardiogram 06/27/2016  Study Conclusions   - Left ventricle: The cavity size was normal. Wall thickness was  normal. Systolic function was normal. The estimated ejection  fraction was in the range of 60% to 65%. Wall motion was normal;  there were no regional wall motion abnormalities. Doppler  parameters are consistent with abnormal left ventricular  relaxation (grade 1 diastolic dysfunction).  - Right atrium: Central venous pressure (est): 3 mm Hg.  - Atrial septum: A patent foramen ovale cannot be excluded.  Consider agitated saline contrast study.  - Tricuspid valve: There was mild regurgitation.  - Pulmonary arteries: PA peak pressure: 26  mm Hg (S).  - Pericardium, extracardiac: There was no pericardial effusion.  Impressions:   - Normal LV wall thickness with LVEF 60-65%. Grade 1 diastolic  dysfunction. Mild tricuspid regurgitation with estimated PASP 26  mmHg. Possible PFO based on limited color Doppler imaging.  Consider agitated saline contrast study   Assessment and Plan:  1. SVT (supraventricular tachycardia) (HCC)   2. Murmur, cardiac   3. Essential hypertension   4. Mixed hyperlipidemia    1. SVT (supraventricular tachycardia) (HCC) Recent episode of SVT resolved with 2 doses of adenosine.  States she is cut down on caffeine intake and has had no further episodes.  Please get a 14-day ZIO monitor to check for arrhythmias.  Please check a TSH, T3-T4.  Continue to eliminate caffeine from your dietary intake.  2. Murmur, cardiac Previous echo for cardiac murmur in 2017: EF 6065%, G1 DD, mild tricuspid regurgitation.  3. Essential hypertension Blood pressure is elevated today on arrival  at 144/90.  Patient states her blood pressure is usually good at home in the 120s to 130s systolic.  Continue losartan/hydrochlorthiazide's 50/12.5 mg daily.  4. Mixed hyperlipidemia Continue Zetia 10 mg daily.  Medication Adjustments/Labs and Tests Ordered: Current medicines are reviewed at length with the patient today.  Concerns regarding medicines are outlined above.   Disposition: Follow-up with Dr. Wyline Mood or APP 6 weeks. Signed, Rennis Harding, NP 08/22/2020 8:16 AM    [facility] at [facility] [address] [place], [place], [place] [address] Phone: [phone]; Fax: [phone]

## 2020-08-22 ENCOUNTER — Encounter: Payer: Self-pay | Admitting: Family Medicine

## 2020-08-22 ENCOUNTER — Ambulatory Visit (INDEPENDENT_AMBULATORY_CARE_PROVIDER_SITE_OTHER): Payer: BLUE CROSS/BLUE SHIELD | Admitting: Family Medicine

## 2020-08-22 VITALS — BP 144/90 | HR 72 | Ht 65.0 in | Wt 187.6 lb

## 2020-08-22 DIAGNOSIS — I10 Essential (primary) hypertension: Secondary | ICD-10-CM

## 2020-08-22 DIAGNOSIS — E782 Mixed hyperlipidemia: Secondary | ICD-10-CM | POA: Diagnosis not present

## 2020-08-22 DIAGNOSIS — R000 Tachycardia, unspecified: Secondary | ICD-10-CM | POA: Diagnosis not present

## 2020-08-22 DIAGNOSIS — R946 Abnormal results of thyroid function studies: Secondary | ICD-10-CM | POA: Diagnosis not present

## 2020-08-22 DIAGNOSIS — R011 Cardiac murmur, unspecified: Secondary | ICD-10-CM | POA: Diagnosis not present

## 2020-08-22 DIAGNOSIS — I471 Supraventricular tachycardia: Secondary | ICD-10-CM | POA: Diagnosis not present

## 2020-08-22 DIAGNOSIS — R002 Palpitations: Secondary | ICD-10-CM

## 2020-08-22 DIAGNOSIS — E663 Overweight: Secondary | ICD-10-CM | POA: Diagnosis not present

## 2020-08-22 DIAGNOSIS — Z6829 Body mass index (BMI) 29.0-29.9, adult: Secondary | ICD-10-CM | POA: Diagnosis not present

## 2020-08-22 NOTE — Patient Instructions (Signed)
Medication Instructions:  Continue all current medications.  Labwork: TSH, T4, T3 - orders given today.   Testing/Procedures: Your physician has recommended that you wear a 14 day event monitor. Event monitors are medical devices that record the heart's electrical activity. Doctors most often [place] these monitors to diagnose arrhythmias. Arrhythmias are problems with the speed or rhythm of the heartbeat. The monitor is a small, portable device. You can wear one while you do your normal daily activities. This is usually used to diagnose what is causing palpitations/syncope (passing out).  Follow-Up:  Office will contact with results via phone or letter.    6 weeks   Any Other Special Instructions Will Be Listed Below (If Applicable).  If you need a refill on your cardiac medications before your next appointment, please call your pharmacy.

## 2020-08-28 ENCOUNTER — Telehealth: Payer: Self-pay | Admitting: Family Medicine

## 2020-08-28 ENCOUNTER — Telehealth: Payer: Self-pay | Admitting: *Deleted

## 2020-08-28 ENCOUNTER — Ambulatory Visit (INDEPENDENT_AMBULATORY_CARE_PROVIDER_SITE_OTHER): Payer: BLUE CROSS/BLUE SHIELD

## 2020-08-28 DIAGNOSIS — R002 Palpitations: Secondary | ICD-10-CM | POA: Diagnosis not present

## 2020-08-28 NOTE — Telephone Encounter (Signed)
Patient informed. Copy sent to PCP °

## 2020-08-28 NOTE — Telephone Encounter (Signed)
Patient called stating that she needs to see if someone can help her put heart monitor on

## 2020-08-28 NOTE — Telephone Encounter (Signed)
Pt will come by office today at 430pm for placement of monitor

## 2020-08-28 NOTE — Telephone Encounter (Signed)
-----   Message from Netta Neat., NP sent at 08/27/2020 10:13 PM EST ----- Thyroid function labs are normal. Thank You

## 2020-09-20 DIAGNOSIS — R002 Palpitations: Secondary | ICD-10-CM | POA: Diagnosis not present

## 2020-09-28 NOTE — Addendum Note (Signed)
Addended by: Eustace Moore on: 09/28/2020 01:56 PM   Modules accepted: Orders

## 2020-10-02 NOTE — Progress Notes (Signed)
Cardiology Office Note  Date: 10/03/2020   ID: Jodi Richardson, DOB 11-Apr-1961, MRN [id number]  PCP:  Jodi Nevins, Jodi Richardson  Cardiologist:  No primary care provider on file. Electrophysiologist:  None   Chief Complaint: Follow up hospital visit SVT  History of Present Illness: Jodi Richardson is a 60 y.o. female with a history of SVT, HTN, HLD.  Previously seen by Dr. Tenny Craw  August 2017.  She was referred for hyperlipidemia.  She had been seen by Dr. Carlena Sax who heard a soft cardiac murmur.  Echocardiogram was ordered for systolic murmur.  Lab work was good be obtained due to to profound LDL over 200.  Dr. Tenny Craw stated she would probably need statin medication.  She was given a referral for sports medicine due to foot pain.  Recent presentation to the emergency room on 08/15/2020 with complaint of a rapid heart rate which was persistent.  Described as a funny beating feeling no real chest pain below shortness of breath.  Patient described as never having anything like this previously.  TSH was elevated at 5.154.  Basic metabolic panel was normal except for elevated glucose of 123.  CBC was normal.  She received an initial dose of adenosine 6 mg IV and a subsequent 12 mg dose which converted her back to normal sinus rhythm which eventually went up to the 120s but was sinus tachycardia.  Patient felt much better and tolerated the chemical cardioversion without any difficulty.  Chest x-ray showed no acute findings.  She reported  heavy caffeine use and it was recommended that she stop caffeine.  She last visit for follow-up.  Stated she had not experienced any more palpitations since the event on 08/15/2020.  Stated she had significantly reduce caffeine intake and drinking decaf coffee and noncaffeinated sodas.  Her TSH was elevated at 5.14 in the emergency room.  She  had no further lab work-up since that time.  She had an upcoming appointment with her primary care provider.  She dated leading up to  the palpitation episodes which caused her to present to the emergency room, she had experienced several episodes leading up to that which were short-lived and nonsustained.  She dated sometimes she would cough and the palpitations would resolve.  She denies any anginal or exertional symptoms, palpitations or arrhythmias, orthostatic symptoms, CVA or TIA-like symptoms, PND, orthopnea, claudication-like symptoms, DVT or PE-like symptoms, or lower extremity edema.  Denies any significant episodes of arrhythmias since last visit except for recent episode lasting approximately 4 minutes after drinking a caffeinated soda.  Otherwise she denies any episodes of palpitations.  States she has a fair amount of stressors in her life and works early morning shifts.  She was on Crestor but was unable to take due to statin associated muscle symptoms.  Currently only on Zetia.   Past Medical History:  Diagnosis Date   Glaucoma    High cholesterol    Hypertension     Past Surgical History:  Procedure Laterality Date   none      Current Outpatient Medications  Medication Sig Dispense Refill   aspirin EC 81 MG tablet Take 81 mg by mouth daily.     cholecalciferol (VITAMIN D) 1000 units tablet Take 1,000 Units by mouth daily.     ezetimibe (ZETIA) 10 MG tablet Take 10 mg by mouth daily. (Patient not taking: Reported on 08/22/2020)     latanoprost (XALATAN) 0.005 % ophthalmic solution Place 1 drop into both eyes daily.  losartan-hydrochlorothiazide (HYZAAR) 50-12.5 MG tablet Take 1 tablet by mouth daily.     Multiple Vitamin (MULTIVITAMIN) tablet Take 1 tablet by mouth daily.     naproxen (NAPROSYN) 500 MG tablet TAKE 1 TABLET BY MOUTH TWICE DAILY WITH A MEAL 60 tablet 0   vitamin B-12 (CYANOCOBALAMIN) 1000 MCG tablet Take 1,000 mcg by mouth daily.     No current facility-administered medications for this visit.   Allergies:  Patient has no known allergies.   Social History: The patient   reports that she has never smoked. She has never used smokeless tobacco. She reports that she does not drink alcohol and does not use drugs.   Family History: The patient's family history includes Diabetes in her brother and sister; High blood pressure in her brother, father, mother, and sister.   ROS:  Please see the history of present illness. Otherwise, complete review of systems is positive for none.  All other systems are reviewed and negative.   Physical Exam: VS:  BP (!) 150/100    Pulse 100    Ht 5\' 5"  (1.651 m)    Wt 191 lb (86.6 kg)    SpO2 99%    BMI 31.78 kg/m , BMI Body mass index is 31.78 kg/m.  Wt Readings from Last 3 Encounters:  10/03/20 191 lb (86.6 kg)  08/22/20 187 lb 9.6 oz (85.1 kg)  08/14/20 190 lb (86.2 kg)    General: Patient appears comfortable at rest. Neck: Supple, no elevated JVP or carotid bruits, no thyromegaly. Lungs: Clear to auscultation, nonlabored breathing at rest. Cardiac: Regular rate and rhythm, no S3 or significant systolic murmur, no pericardial rub. Extremities: No pitting edema, distal pulses 2+. Skin: Warm and dry. Musculoskeletal: No kyphosis. Neuropsychiatric: Alert and oriented x3, affect grossly appropriate.  ECG:  EKG after cardioversion for SVT with adenosine on August 14, 2020 sinus tachycardia with a rate of 126.  Recent Labwork: 08/14/2020: BUN 11; Creatinine, Ser 0.96; Hemoglobin 13.0; Platelets 336; Potassium 3.7; Sodium 142; TSH 5.154     Component Value Date/Time   CHOL 197 08/14/2016 1010   TRIG 96 08/14/2016 1010   HDL 66 08/14/2016 1010   CHOLHDL 3.0 08/14/2016 1010   VLDL 19 08/14/2016 1010   LDLCALC 112 (H) 08/14/2016 1010    Other Studies Reviewed Today:   Cardiac monitoring 09/28/2020 Study Highlights  Sinus rhythm   Heart rates 50 to 171 bpm   Average HR 81 bpm    Occasional PACs, short bursts of SVT   Longest spell of SVT was approximately 16 seconds   170 bpm   Not sensed   Occurred 1:30 AM Diary  entries:  One fluttering sensation correlated with short burst SVT  Other times it was SR.  Other sensations (pressure) correlated with SR Preliminary Findings Final Interpretation Patient had a min HR of 52 bpm, max HR of 171 bpm, and avg HR of 81 bpm. Predominant underlying rhythm was Sinus Rhythm. 3 Supraventricular Tachycardia runs occurred, the run with the fastest interval lasting 15.8 secs with a max rate of 171 bpm (avg 149 bpm); the run with the fastest interval was also the longest. Supraventricular Tachycardia was detected within +/- 45 seconds of symptomatic patient event(s). Isolated SVEs were rare (<1.0%), SVE Couplets were rare (<1.0%), and SVE Triplets were rare (<1.0%). Isolated VEs were rare (<1.0%), and no VE Couplets or VE Triplets were pres    Echocardiogram 06/27/2016  Study Conclusions   - Left ventricle: The cavity size was  normal. Wall thickness was  normal. Systolic function was normal. The estimated ejection  fraction was in the range of 60% to 65%. Wall motion was normal;  there were no regional wall motion abnormalities. Doppler  parameters are consistent with abnormal left ventricular  relaxation (grade 1 diastolic dysfunction).  - Right atrium: Central venous pressure (est): 3 mm Hg.  - Atrial septum: A patent foramen ovale cannot be excluded.  Consider agitated saline contrast study.  - Tricuspid valve: There was mild regurgitation.  - Pulmonary arteries: PA peak pressure: 26 mm Hg (S).  - Pericardium, extracardiac: There was no pericardial effusion.  Impressions:   - Normal LV wall thickness with LVEF 60-65%. Grade 1 diastolic  dysfunction. Mild tricuspid regurgitation with estimated PASP 26  mmHg. Possible PFO based on limited color Doppler imaging.  Consider agitated saline contrast study   Assessment and Plan:   1. SVT (supraventricular tachycardia) (HCC)   Recent cardiac monitor showed she had occasional PACs and short  burst of SVT.  The longest duration was about 16 seconds and heart rate was around 170.  This occurred around 1:30 in the morning was not since.  1 fluttering sensation corresponded to 1 short burst of SVT.  All other times it was normal sinus rhythm.  All other sensations of pressure were associated with normal sinus rhythm.  No significant major arrhythmias.  See report above.  Patient states she recently had 1 short episode of rapid heart rate lasting about 4 minutes after she drank a caffeinated soda.  Otherwise no new episodes.  Advised to watch caffeine intake or any other stimulants. Recent thyroid panel 08/22/2020: TSH 1.4, T4 1.05, T3 2.9   2. Murmur, cardiac Previous echo for cardiac murmur in 2017: EF 6065%, G1 DD, mild tricuspid regurgitation.  3. Essential hypertension Blood pressure is elevated today on arrival at blood pressure elevated 150/100 today.  Patient states her blood pressure is usually around 127/70 at home.  Continue losartan/hydrochlorthiazide 50/12.5 mg daily.  4. Mixed hyperlipidemia Continue Zetia 10 mg daily.  Advised her to follow-up with PCP regarding starting a lower intensity statin medication.  She could not tolerate Crestor due to statin associated muscle symptoms.  Medication Adjustments/Labs and Tests Ordered: Current medicines are reviewed at length with the patient today.  Concerns regarding medicines are outlined above.   Disposition: Follow-up with Dr. Wyline Mood or APP 6 months Signed, Rennis Harding, NP 10/03/2020 3:54 PM    [facility] at [facility] [address] [place], [place], [place] [address] Phone: [phone]; Fax: [phone]

## 2020-10-03 ENCOUNTER — Ambulatory Visit (INDEPENDENT_AMBULATORY_CARE_PROVIDER_SITE_OTHER): Payer: BLUE CROSS/BLUE SHIELD | Admitting: Family Medicine

## 2020-10-03 ENCOUNTER — Other Ambulatory Visit: Payer: Self-pay

## 2020-10-03 ENCOUNTER — Encounter: Payer: Self-pay | Admitting: Family Medicine

## 2020-10-03 VITALS — BP 150/100 | HR 100 | Ht 65.0 in | Wt 191.0 lb

## 2020-10-03 DIAGNOSIS — I471 Supraventricular tachycardia, unspecified: Secondary | ICD-10-CM

## 2020-10-03 DIAGNOSIS — R011 Cardiac murmur, unspecified: Secondary | ICD-10-CM

## 2020-10-03 NOTE — Patient Instructions (Addendum)
Medication Instructions:  Continue all current medications.   Labwork: none  Testing/Procedures: none  Follow-Up: 6 months   Any Other Special Instructions Will Be Listed Below (If Applicable).   If you need a refill on your cardiac medications before your next appointment, please call your pharmacy.  

## 2020-11-22 ENCOUNTER — Other Ambulatory Visit: Payer: Self-pay | Admitting: Orthopedic Surgery

## 2021-01-09 DIAGNOSIS — H401231 Low-tension glaucoma, bilateral, mild stage: Secondary | ICD-10-CM | POA: Diagnosis not present

## 2021-02-05 DIAGNOSIS — Z1231 Encounter for screening mammogram for malignant neoplasm of breast: Secondary | ICD-10-CM | POA: Diagnosis not present

## 2021-02-17 DIAGNOSIS — Z20822 Contact with and (suspected) exposure to covid-19: Secondary | ICD-10-CM | POA: Diagnosis not present

## 2021-02-17 DIAGNOSIS — U071 COVID-19: Secondary | ICD-10-CM | POA: Diagnosis not present

## 2021-02-17 DIAGNOSIS — M791 Myalgia, unspecified site: Secondary | ICD-10-CM | POA: Diagnosis not present

## 2021-03-21 IMAGING — DX DG CHEST 1V PORT
1 series · 2 of 2 positions shown · non-contrast
Comparison: Prior chest radiographs 02/03/2015.

CLINICAL DATA: Palpitations.  History of hypertension.

EXAM:
PORTABLE CHEST 1 VIEW

[Series 1: chest ap · 0.14mm/px · 2 of 2 slices shown]
[im 1/2]
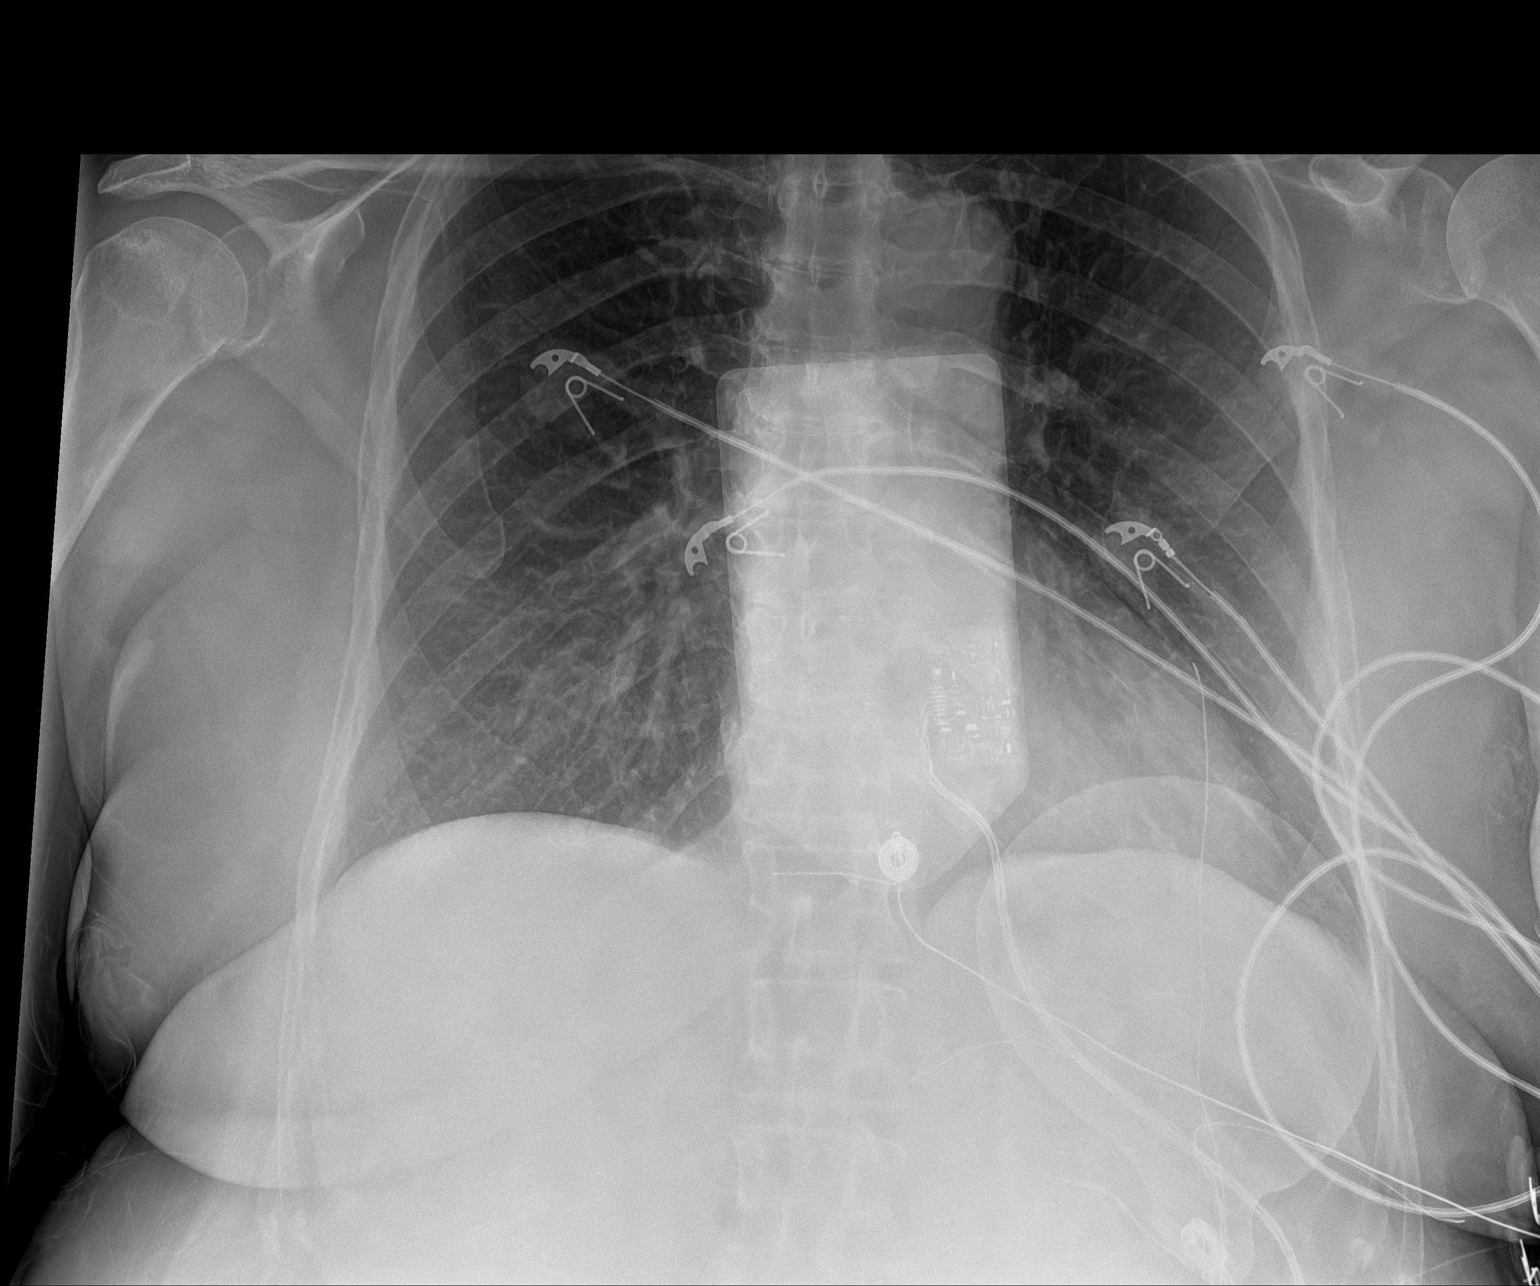
[im 2/2]
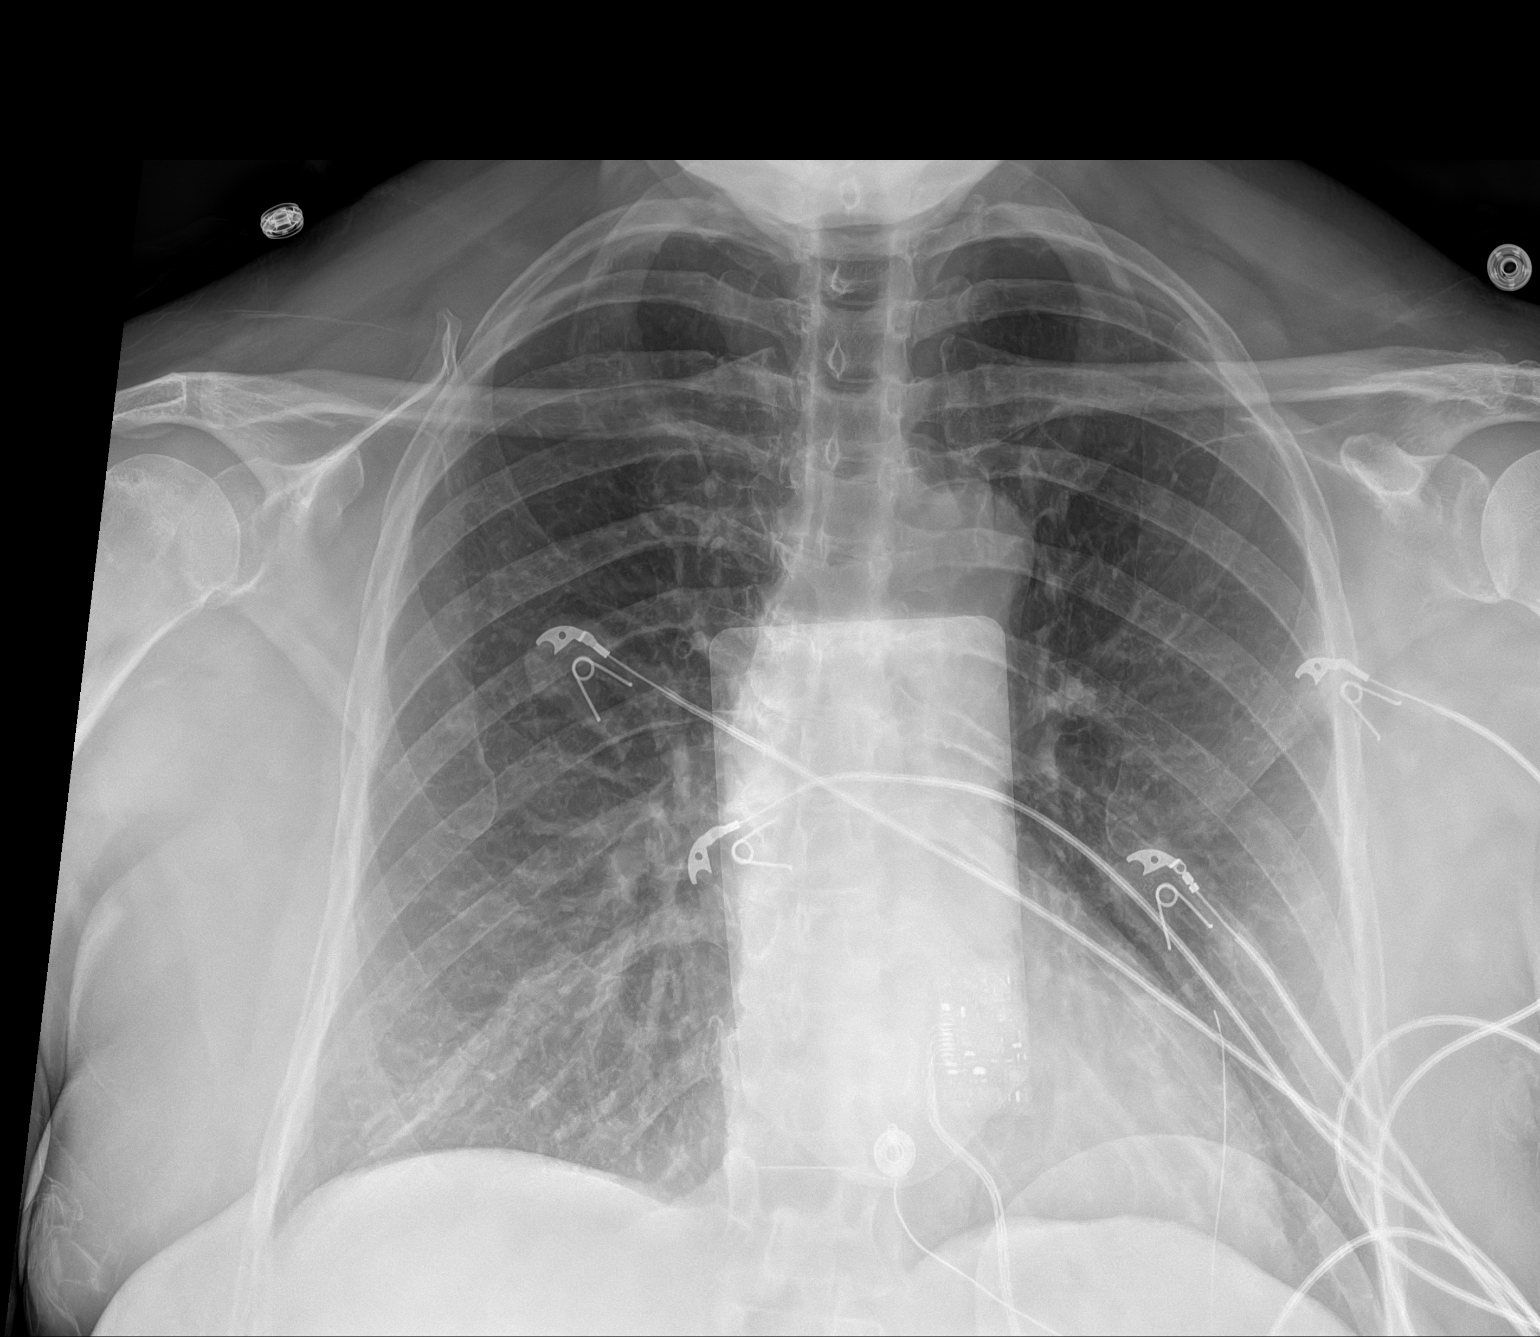

[2 of 2 positions shown; findings below may reference images not displayed]

FINDINGS: Cardiac monitors overlie the chest. Heart size within normal limits.
No appreciable airspace consolidation or pulmonary edema. No
evidence of pleural effusion or pneumothorax. No acute bony
abnormality identified.
IMPRESSION: No evidence of acute cardiopulmonary abnormality.

## 2021-06-15 DIAGNOSIS — E782 Mixed hyperlipidemia: Secondary | ICD-10-CM | POA: Diagnosis not present

## 2021-06-15 DIAGNOSIS — Z0001 Encounter for general adult medical examination with abnormal findings: Secondary | ICD-10-CM | POA: Diagnosis not present

## 2021-06-15 DIAGNOSIS — Z780 Asymptomatic menopausal state: Secondary | ICD-10-CM | POA: Diagnosis not present

## 2021-06-15 DIAGNOSIS — I10 Essential (primary) hypertension: Secondary | ICD-10-CM | POA: Diagnosis not present

## 2021-06-15 DIAGNOSIS — M1991 Primary osteoarthritis, unspecified site: Secondary | ICD-10-CM | POA: Diagnosis not present

## 2021-06-15 DIAGNOSIS — E7849 Other hyperlipidemia: Secondary | ICD-10-CM | POA: Diagnosis not present

## 2021-06-27 ENCOUNTER — Encounter: Payer: Self-pay | Admitting: Internal Medicine

## 2021-07-05 NOTE — Progress Notes (Addendum)
Cardiology Office Note  Date: 07/06/2021   ID: Jodi Richardson, DOB 07/27/1961, MRN [id number]  PCP:  Elfredia Nevins, MD  Cardiologist:  None Electrophysiologist:  None   Chief Complaint: Follow up hospital visit SVT  History of Present Illness: Jodi Richardson is a 60 y.o. female with a history of SVT, HTN, HLD.  Previously seen by Dr. Tenny Craw  August 2017.  She was referred for hyperlipidemia.  She had been seen by Dr. Carlena Sax who heard a soft cardiac murmur.  Echocardiogram was ordered for systolic murmur.  Lab work was good be obtained due to to profound LDL over 200.  Dr. Tenny Craw stated she would probably need statin medication.  She was given a referral for sports medicine due to foot pain.  Recent presentation to the emergency room on 08/15/2020 with complaint of a rapid heart rate which was persistent.  Described as a funny beating feeling no real chest pain below shortness of breath.  Patient described as never having anything like this previously.  TSH was elevated at 5.154.  Basic metabolic panel was normal except for elevated glucose of 123.  CBC was normal.  She received an initial dose of adenosine 6 mg IV and a subsequent 12 mg dose which converted her back to normal sinus rhythm which eventually went up to the 120s but was sinus tachycardia.  Patient felt much better and tolerated the chemical cardioversion without any difficulty.  Chest x-ray showed no acute findings.  She reported  heavy caffeine use and it was recommended that she stop caffeine.  She was here last visit for follow-up.  Stated she had not experienced any more palpitations since the event on 08/15/2020.  Stated she had significantly reduce caffeine intake and drinking decaf coffee and noncaffeinated sodas.  Her TSH was elevated at 5.14 in the emergency room.  She  had no further lab work-up since that time.  She had an upcoming appointment with her primary care provider.  She dated leading up to the palpitation  episodes which caused her to present to the emergency room, she had experienced several episodes leading up to that which were short-lived and nonsustained.  She dated sometimes she would cough and the palpitations would resolve.  She denies any anginal or exertional symptoms, palpitations or arrhythmias, orthostatic symptoms, CVA or TIA-like symptoms, PND, orthopnea, claudication-like symptoms, DVT or PE-like symptoms, or lower extremity edema.  She did not have any significant episodes of arrhythmias since last visit except for recent episode lasting approximately 4 minutes after drinking a caffeinated soda.  Otherwise she denies any episodes of palpitations.  She had a fair amount of stressors in her life and was working early morning shifts.  She was on Crestor but was unable to take due to statin associated muscle symptoms.  She was only on Zetia.  She is here for 25-month follow-up today.  She states she has occasional palpitations which are not necessarily bothersome and do not last very long.  She has learned to cough when these occur as a vagal maneuver which usually terminates the palpitations.  We discussed vagal maneuvers and effectiveness.  Her EKG today shows normal sinus rhythm rate of 64 no ectopy noted.  She denies any DOE or SOB, PND, orthopnea, orthostatic symptoms, CVA or TIA-like symptoms, bleeding issues, claudication-like symptoms, DVT or PE-like symptoms, or lower extremity edema.  She states she sometimes takes trazodone to help her sleep and this makes her feel a little unusual but otherwise  no issues.  She states her primary care provider recently started her on atorvastatin for elevated cholesterol.  She takes a baby aspirin as well.  Blood pressure is reasonably well controlled today at 136/80.    Past Medical History:  Diagnosis Date   Glaucoma    High cholesterol    Hypertension     Past Surgical History:  Procedure Laterality Date   none      Current Outpatient  Medications  Medication Sig Dispense Refill   aspirin EC 81 MG tablet Take 81 mg by mouth daily.     atorvastatin (LIPITOR) 10 MG tablet Take 10 mg by mouth daily.     cholecalciferol (VITAMIN D) 1000 units tablet Take 1,000 Units by mouth daily.     latanoprost (XALATAN) 0.005 % ophthalmic solution Place 1 drop into both eyes daily.     losartan-hydrochlorothiazide (HYZAAR) 50-12.5 MG tablet Take 1 tablet by mouth daily.     Multiple Vitamin (MULTIVITAMIN) tablet Take 1 tablet by mouth daily.     naproxen (NAPROSYN) 375 MG tablet Take 1 tablet (375 mg total) by mouth 2 (two) times daily with a meal. 60 tablet 1   vitamin B-12 (CYANOCOBALAMIN) 1000 MCG tablet Take 1,000 mcg by mouth daily.     estradiol (CLIMARA - DOSED IN MG/24 HR) 0.075 mg/24hr patch Place 0.075 mg onto the skin once a week. (Patient not taking: Reported on 07/06/2021)     No current facility-administered medications for this visit.   Allergies:  Patient has no known allergies.   Social History: The patient  reports that she has never smoked. She has never used smokeless tobacco. She reports that she does not drink alcohol and does not use drugs.   Family History: The patient's family history includes Diabetes in her brother and sister; High blood pressure in her brother, father, mother, and sister.   ROS:  Please see the history of present illness. Otherwise, complete review of systems is positive for none.  All other systems are reviewed and negative.   Physical Exam: VS:  BP 136/80   Pulse 74   Ht 5\' 5"  (1.651 m)   Wt 174 lb 6.4 oz (79.1 kg)   SpO2 95%   BMI 29.02 kg/m , BMI Body mass index is 29.02 kg/m.  Wt Readings from Last 3 Encounters:  07/06/21 174 lb 6.4 oz (79.1 kg)  10/03/20 191 lb (86.6 kg)  08/22/20 187 lb 9.6 oz (85.1 kg)    General: Patient appears comfortable at rest. Neck: Supple, no elevated JVP or carotid bruits, no thyromegaly. Lungs: Clear to auscultation, nonlabored breathing at  rest. Cardiac: Regular rate and rhythm, no S3 or significant systolic murmur, no pericardial rub. Extremities: No pitting edema, distal pulses 2+. Skin: Warm and dry. Musculoskeletal: No kyphosis. Neuropsychiatric: Alert and oriented x3, affect grossly appropriate.  ECG: 07/06/2021 normal sinus rhythm rate of 64.   Recent Labwork: 08/14/2020: BUN 11; Creatinine, Ser 0.96; Hemoglobin 13.0; Platelets 336; Potassium 3.7; Sodium 142; TSH 5.154     Component Value Date/Time   CHOL 197 08/14/2016 1010   TRIG 96 08/14/2016 1010   HDL 66 08/14/2016 1010   CHOLHDL 3.0 08/14/2016 1010   VLDL 19 08/14/2016 1010   LDLCALC 112 (H) 08/14/2016 1010    Other Studies Reviewed Today:   Cardiac monitoring 09/28/2020 Study Highlights  Sinus rhythm   Heart rates 50 to 171 bpm   Average HR 81 bpm    Occasional PACs, short bursts of SVT  Longest spell of SVT was approximately 16 seconds   170 bpm   Not sensed   Occurred 1:30 AM Diary entries:  One fluttering sensation correlated with short burst SVT  Other times it was SR.  Other sensations (pressure) correlated with SR Preliminary Findings Final Interpretation Patient had a min HR of 52 bpm, max HR of 171 bpm, and avg HR of 81 bpm. Predominant underlying rhythm was Sinus Rhythm. 3 Supraventricular Tachycardia runs occurred, the run with the fastest interval lasting 15.8 secs with a max rate of 171 bpm (avg 149 bpm); the run with the fastest interval was also the longest. Supraventricular Tachycardia was detected within +/- 45 seconds of symptomatic patient event(s). Isolated SVEs were rare (<1.0%), SVE Couplets were rare (<1.0%), and SVE Triplets were rare (<1.0%). Isolated VEs were rare (<1.0%), and no VE Couplets or VE Triplets were pres    Echocardiogram 06/27/2016  Study Conclusions   - Left ventricle: The cavity size was normal. Wall thickness was    normal. Systolic function was normal. The estimated ejection    fraction was in the  range of 60% to 65%. Wall motion was normal;    there were no regional wall motion abnormalities. Doppler    parameters are consistent with abnormal left ventricular    relaxation (grade 1 diastolic dysfunction).  - Right atrium: Central venous pressure (est): 3 mm Hg.  - Atrial septum: A patent foramen ovale cannot be excluded.    Consider agitated saline contrast study.  - Tricuspid valve: There was mild regurgitation.  - Pulmonary arteries: PA peak pressure: 26 mm Hg (S).  - Pericardium, extracardiac: There was no pericardial effusion.  Impressions:   - Normal LV wall thickness with LVEF 60-65%. Grade 1 diastolic    dysfunction. Mild tricuspid regurgitation with estimated PASP 26    mmHg. Possible PFO based on limited color Doppler imaging.    Consider agitated saline contrast study   Assessment and Plan:   1. SVT (supraventricular tachycardia) (HCC) She denies any significant episodes of palpitations.  She states she has occasional palpitations which are usually short-lived.  She states she usually will cough and they may resolve.  We discussed vagal maneuvers which may help.  She verbalized understanding.  2. Murmur, cardiac Previous echo for cardiac murmur in 2017: EF 6065%, G1 DD, mild tricuspid regurgitation.  3. Essential hypertension Blood pressure today 136/80.   Continue losartan/hydrochlorthiazide 50/12.5 mg daily.  4. Mixed hyperlipidemia Continue Zetia 10 mg daily.  Patient states she had recent lipid panel with her PCP and he started her on atorvastatin 10 mg daily.  Continue aspirin 81 mg daily.  She is tolerating well without any issues.   Medication Adjustments/Labs and Tests Ordered: Current medicines are reviewed at length with the patient today.  Concerns regarding medicines are outlined above.   Disposition: Follow-up with Dr. Wyline Mood or APP 6 months Signed, Rennis Harding, NP 07/06/2021 4:06 PM    [facility] [address]  [place], [place], [place] [address] Phone: [phone]; Fax: [phone]

## 2021-07-06 ENCOUNTER — Other Ambulatory Visit: Payer: Self-pay

## 2021-07-06 ENCOUNTER — Ambulatory Visit: Payer: BLUE CROSS/BLUE SHIELD | Admitting: Family Medicine

## 2021-07-06 ENCOUNTER — Encounter: Payer: Self-pay | Admitting: Family Medicine

## 2021-07-06 VITALS — BP 136/80 | HR 74 | Ht 65.0 in | Wt 174.4 lb

## 2021-07-06 DIAGNOSIS — E782 Mixed hyperlipidemia: Secondary | ICD-10-CM | POA: Diagnosis not present

## 2021-07-06 DIAGNOSIS — I471 Supraventricular tachycardia, unspecified: Secondary | ICD-10-CM

## 2021-07-06 DIAGNOSIS — I10 Essential (primary) hypertension: Secondary | ICD-10-CM | POA: Diagnosis not present

## 2021-07-06 DIAGNOSIS — R011 Cardiac murmur, unspecified: Secondary | ICD-10-CM | POA: Diagnosis not present

## 2021-07-06 NOTE — Patient Instructions (Signed)
Medication Instructions:  Your physician recommends that you continue on your current medications as directed. Please refer to the Current Medication list given to you today.  Labwork: none  Testing/Procedures: none  Follow-Up: Your physician recommends that you schedule a follow-up appointment in: 6 months with your new cardiologist in [place]  Any Other Special Instructions Will Be Listed Below (If Applicable).  If you need a refill on your cardiac medications before your next appointment, please call your pharmacy.

## 2021-07-26 DIAGNOSIS — H401231 Low-tension glaucoma, bilateral, mild stage: Secondary | ICD-10-CM | POA: Diagnosis not present

## 2021-07-31 ENCOUNTER — Ambulatory Visit: Payer: BLUE CROSS/BLUE SHIELD

## 2021-08-02 ENCOUNTER — Ambulatory Visit: Payer: BLUE CROSS/BLUE SHIELD

## 2021-09-10 ENCOUNTER — Ambulatory Visit: Payer: BLUE CROSS/BLUE SHIELD

## 2021-10-05 ENCOUNTER — Telehealth: Payer: Self-pay | Admitting: Student

## 2021-10-05 ENCOUNTER — Encounter: Payer: Self-pay | Admitting: Family Medicine

## 2021-10-05 MED ORDER — METOPROLOL SUCCINATE ER 25 MG PO TB24: 25.0000 mg | ORAL_TABLET | Freq: Every day | ORAL | 2 refills | Status: DC

## 2021-10-05 NOTE — Telephone Encounter (Signed)
Patient c/o Palpitations:  High priority if patient c/o lightheadedness, shortness of breath, or chest pain  How long have you had palpitations/irregular HR/ Afib? Are you having the symptoms now? Since 12, yes  Are you currently experiencing lightheadedness, SOB or CP? no  Do you have a history of afib (atrial fibrillation) or irregular heart rhythm? yes  Have you checked your BP or HR? (document readings if available): no  Are you experiencing any other symptoms? No    Patient states she started having palpitations today around 12:15 pm today. She says she is not having any other symptoms and the palpitations have slowed down, but her HR is still fast. She says she was at work and could not check her HR.

## 2021-10-05 NOTE — Telephone Encounter (Signed)
error 

## 2021-10-05 NOTE — Telephone Encounter (Signed)
Pt stated that she has been experiencing a racing heart with rates at 158/159 per her apple watch. Pt stated that she is not experiencing any SOB, dizziness or CP. Pt stated that her bp is 144/90. Pt has appt with Dr. Mayford Knife on 4/12.   Will fwd to DOD as pt has no primary cardiologist

## 2021-10-05 NOTE — Telephone Encounter (Signed)
Patient notified and verbalized understanding. Toprol XL 25 mg tablets sent to [facility] pharmacy per pt request.

## 2021-11-27 DIAGNOSIS — H401231 Low-tension glaucoma, bilateral, mild stage: Secondary | ICD-10-CM | POA: Diagnosis not present

## 2022-01-15 ENCOUNTER — Ambulatory Visit: Payer: BLUE CROSS/BLUE SHIELD | Admitting: Cardiology

## 2022-03-12 NOTE — Progress Notes (Unsigned)
Cardiology Office Note:    Date:  03/13/2022   ID:  Jodi Richardson, DOB 06/15/1961, MRN [id number]  PCP:  Elfredia Nevins, MD   [facility] Providers Cardiologist:  None     Referring MD: Elfredia Nevins, MD   CC: Establish with new cardiologist  History of Present Illness:    Jodi Richardson is a 61 y.o. female with a hx of FH, SVT (paroxysmal) and Mild TR 2017.  Saw Dr. Tenny Craw in 2017; APP in 2022.  Patient notes that she is doing fine. Works as a Designer, industrial/product AM shift; this is the most active thing she does; pre-COVID-19 went on the tread   Called with with palpitations and started succinate 2023. There are no interval hospital/ED visit.    No chest pain or pressure .  No SOB/DOE and no PND/Orthopnea.  No weight gain or leg swelling. Had another spell of SVT.  Has not had a long spell on metoprolol; rare short spell.  Past Medical History:  Diagnosis Date   Glaucoma    High cholesterol    Hypertension     Past Surgical History:  Procedure Laterality Date   none      Current Medications: Current Meds  Medication Sig   cholecalciferol (VITAMIN D) 1000 units tablet Take 1,000 Units by mouth daily.   estradiol (CLIMARA - DOSED IN MG/24 HR) 0.075 mg/24hr patch Place 0.075 mg onto the skin once a week.   ezetimibe (ZETIA) 10 MG tablet Take 1 tablet (10 mg total) by mouth daily.   latanoprost (XALATAN) 0.005 % ophthalmic solution Place 1 drop into both eyes daily.   losartan-hydrochlorothiazide (HYZAAR) 50-12.5 MG tablet Take 1 tablet by mouth daily.   metoprolol succinate (TOPROL XL) 25 MG 24 hr tablet Take 1 tablet (25 mg total) by mouth daily.   Multiple Vitamin (MULTIVITAMIN) tablet Take 1 tablet by mouth daily.   traZODone (DESYREL) 50 MG tablet Take 50 mg by mouth as needed.   vitamin B-12 (CYANOCOBALAMIN) 1000 MCG tablet Take 1,000 mcg by mouth daily.     Allergies:   Patient has no known allergies.   Social History   Socioeconomic History    Marital status: Married    Spouse name: Not on file   Number of children: Not on file   Years of education: Not on file   Highest education level: Not on file  Occupational History   Not on file  Tobacco Use   Smoking status: Never   Smokeless tobacco: Never  Substance and Sexual Activity   Alcohol use: No   Drug use: No   Sexual activity: Not on file  Other Topics Concern   Not on file  Social History Narrative   Not on file   Social Determinants of Health   Financial Resource Strain: Not on file  Food Insecurity: Not on file  Transportation Needs: Not on file  Physical Activity: Not on file  Stress: Not on file  Social Connections: Not on file     Family History: The patient's family history includes Diabetes in her brother and sister; High blood pressure in her brother, father, mother, and sister.  ROS:   Please see the history of present illness.     All other systems reviewed and are negative.  EKGs/Labs/Other Studies Reviewed:    The following studies were reviewed today:  Recent Labs: No results found for requested labs within last 365 days.  Recent Lipid Panel    Component Value  Date/Time   CHOL 197 08/14/2016 1010   TRIG 96 08/14/2016 1010   HDL 66 08/14/2016 1010   CHOLHDL 3.0 08/14/2016 1010   VLDL 19 08/14/2016 1010   LDLCALC 112 (H) 08/14/2016 1010        Physical Exam:    VS:  BP 122/82   Pulse (!) 59   Ht 5\' 5"  (1.651 m)   Wt 173 lb (78.5 kg)   SpO2 98%   BMI 28.79 kg/m     Wt Readings from Last 3 Encounters:  03/13/22 173 lb (78.5 kg)  07/06/21 174 lb 6.4 oz (79.1 kg)  10/03/20 191 lb (86.6 kg)    Gen: No distress   Neck: No JVD, no carotid bruit Cardiac: No Rubs or Gallops, no murmur, bradycardia, +2 radial pulses Respiratory: Clear to auscultation bilaterally, normal effort, normal  respiratory rate GI: Soft, nontender, non-distended  MS: No edema;  moves all extremities Integument: Skin feels warm Neuro:  At time of  evaluation, alert and oriented to person/place/time/situation  Psych: Normal affect, patient feels well  ASSESSMENT:    1. SVT (supraventricular tachycardia) (HCC)   2. Mild tricuspid regurgitation   3. Mixed hyperlipidemia   4. Essential hypertension    PLAN:    Paroxysmal SVT - doing well on succinate 25 mg PO daily, we reviewed other options  HTN Mild TR - controlled on current regimen no changes - will repeat echo  FH Complicated by myalgias and fatigue on atorvastatin 5 mg  - we will try Zetia 10 mg PO daily, she is not on atorvastatin 10 mg - discussed risks and long term prevention - will check labs in 2 months     One year with me if no issues  Medication Adjustments/Labs and Tests Ordered: Current medicines are reviewed at length with the patient today.  Concerns regarding medicines are outlined above.  Orders Placed This Encounter  Procedures   Lipid panel   ECHOCARDIOGRAM COMPLETE   Meds ordered this encounter  Medications   ezetimibe (ZETIA) 10 MG tablet    Sig: Take 1 tablet (10 mg total) by mouth daily.    Dispense:  90 tablet    Refill:  3    03/13/22 New Start    Patient Instructions  Medication Instructions:  Your physician has recommended you make the following change in your medication:  Start zetia 10 mg once a day Continue all other medications as directed  Labwork: Fasting lipid panel in 2 months at [facility]  Testing/Procedures: Your physician has requested that you have an echocardiogram. Echocardiography is a painless test that uses sound waves to create images of your heart. It provides your doctor with information about the size and shape of your heart and how well your heart's chambers and valves are working. This procedure takes approximately one hour. There are no restrictions for this procedure.   Follow-Up: Your physician recommends that you schedule a follow-up appointment in: 1 year  Any Other Special Instructions Will Be Listed  Below (If Applicable).  You will receive a reminder call in about 10 months reminding you to schedule your appointment. If you don't receive this call, please contact our office.   If you need a refill on your cardiac medications before your next appointment, please call your pharmacy.    Signed, [facility], MD  03/13/2022 4:32 PM    [facility]

## 2022-03-13 ENCOUNTER — Ambulatory Visit: Payer: BLUE CROSS/BLUE SHIELD | Admitting: Internal Medicine

## 2022-03-13 ENCOUNTER — Encounter: Payer: Self-pay | Admitting: Internal Medicine

## 2022-03-13 VITALS — BP 122/82 | HR 59 | Ht 65.0 in | Wt 173.0 lb

## 2022-03-13 DIAGNOSIS — E782 Mixed hyperlipidemia: Secondary | ICD-10-CM

## 2022-03-13 DIAGNOSIS — I071 Rheumatic tricuspid insufficiency: Secondary | ICD-10-CM | POA: Diagnosis not present

## 2022-03-13 DIAGNOSIS — I471 Supraventricular tachycardia: Secondary | ICD-10-CM | POA: Diagnosis not present

## 2022-03-13 DIAGNOSIS — I10 Essential (primary) hypertension: Secondary | ICD-10-CM | POA: Insufficient documentation

## 2022-03-13 MED ORDER — EZETIMIBE 10 MG PO TABS: 10.0000 mg | ORAL_TABLET | Freq: Every day | ORAL | 3 refills | Status: DC

## 2022-03-13 NOTE — Patient Instructions (Addendum)
Medication Instructions:  Your physician has recommended you make the following change in your medication:  Start zetia 10 mg once a day Continue all other medications as directed  Labwork: Fasting lipid panel in 2 months at [facility]  Testing/Procedures: Your physician has requested that you have an echocardiogram. Echocardiography is a painless test that uses sound waves to create images of your heart. It provides your doctor with information about the size and shape of your heart and how well your heart's chambers and valves are working. This procedure takes approximately one hour. There are no restrictions for this procedure.   Follow-Up: Your physician recommends that you schedule a follow-up appointment in: 1 year  Any Other Special Instructions Will Be Listed Below (If Applicable).  You will receive a reminder call in about 10 months reminding you to schedule your appointment. If you don't receive this call, please contact our office.   If you need a refill on your cardiac medications before your next appointment, please call your pharmacy.

## 2022-03-18 ENCOUNTER — Other Ambulatory Visit: Payer: Self-pay | Admitting: Internal Medicine

## 2022-03-18 DIAGNOSIS — Z1231 Encounter for screening mammogram for malignant neoplasm of breast: Secondary | ICD-10-CM | POA: Diagnosis not present

## 2022-03-18 DIAGNOSIS — I071 Rheumatic tricuspid insufficiency: Secondary | ICD-10-CM

## 2022-03-18 DIAGNOSIS — I471 Supraventricular tachycardia: Secondary | ICD-10-CM

## 2022-03-21 ENCOUNTER — Other Ambulatory Visit: Payer: Self-pay | Admitting: Cardiology

## 2022-03-27 ENCOUNTER — Ambulatory Visit (INDEPENDENT_AMBULATORY_CARE_PROVIDER_SITE_OTHER): Payer: BLUE CROSS/BLUE SHIELD

## 2022-03-27 DIAGNOSIS — I471 Supraventricular tachycardia: Secondary | ICD-10-CM

## 2022-03-27 DIAGNOSIS — I071 Rheumatic tricuspid insufficiency: Secondary | ICD-10-CM | POA: Diagnosis not present

## 2022-03-27 LAB — ECHOCARDIOGRAM COMPLETE
Area-P 1/2: 3.19 cm2
Calc EF: 63.7 %
MV M vel: 1.53 m/s
MV Peak grad: 9.4 mmHg
S' Lateral: 2.01 cm
Single Plane A2C EF: 67.7 %
Single Plane A4C EF: 60.9 %

## 2022-03-28 DIAGNOSIS — R920 Mammographic microcalcification found on diagnostic imaging of breast: Secondary | ICD-10-CM | POA: Diagnosis not present

## 2022-03-28 DIAGNOSIS — R928 Other abnormal and inconclusive findings on diagnostic imaging of breast: Secondary | ICD-10-CM | POA: Diagnosis not present

## 2022-05-30 DIAGNOSIS — H5340 Unspecified visual field defects: Secondary | ICD-10-CM | POA: Diagnosis not present

## 2022-05-30 DIAGNOSIS — H401222 Low-tension glaucoma, left eye, moderate stage: Secondary | ICD-10-CM | POA: Diagnosis not present

## 2022-06-21 DIAGNOSIS — E559 Vitamin D deficiency, unspecified: Secondary | ICD-10-CM | POA: Diagnosis not present

## 2022-06-21 DIAGNOSIS — Z6827 Body mass index (BMI) 27.0-27.9, adult: Secondary | ICD-10-CM | POA: Diagnosis not present

## 2022-06-21 DIAGNOSIS — I10 Essential (primary) hypertension: Secondary | ICD-10-CM | POA: Diagnosis not present

## 2022-06-21 DIAGNOSIS — E538 Deficiency of other specified B group vitamins: Secondary | ICD-10-CM | POA: Diagnosis not present

## 2022-06-21 DIAGNOSIS — Z1331 Encounter for screening for depression: Secondary | ICD-10-CM | POA: Diagnosis not present

## 2022-06-21 DIAGNOSIS — E663 Overweight: Secondary | ICD-10-CM | POA: Diagnosis not present

## 2022-06-21 DIAGNOSIS — F411 Generalized anxiety disorder: Secondary | ICD-10-CM | POA: Diagnosis not present

## 2022-06-21 DIAGNOSIS — M1991 Primary osteoarthritis, unspecified site: Secondary | ICD-10-CM | POA: Diagnosis not present

## 2022-06-21 DIAGNOSIS — Z0001 Encounter for general adult medical examination with abnormal findings: Secondary | ICD-10-CM | POA: Diagnosis not present

## 2022-06-24 ENCOUNTER — Encounter: Payer: Self-pay | Admitting: Internal Medicine

## 2022-06-24 ENCOUNTER — Telehealth: Payer: Self-pay

## 2022-06-24 DIAGNOSIS — E782 Mixed hyperlipidemia: Secondary | ICD-10-CM

## 2022-06-24 NOTE — Telephone Encounter (Signed)
Referral for Lipid clinic placed.

## 2022-06-24 NOTE — Telephone Encounter (Signed)
-----   Message from Werner Lean, MD sent at 06/24/2022 10:55 AM EDT ----- Results: LDL 160 on zetia and highest tolerated statin Plan: Lipid clinic  Werner Lean, MD

## 2022-06-25 ENCOUNTER — Encounter: Payer: Self-pay | Admitting: Internal Medicine

## 2022-06-25 ENCOUNTER — Encounter (INDEPENDENT_AMBULATORY_CARE_PROVIDER_SITE_OTHER): Payer: Self-pay | Admitting: *Deleted

## 2022-06-25 ENCOUNTER — Telehealth: Payer: Self-pay | Admitting: Internal Medicine

## 2022-06-25 NOTE — Telephone Encounter (Signed)
Pt PCP suggested pt take crestor and pt wanted to take atorvastatin instead and she was told her to take Q10 with it, pt wants to know if she still needs to see the pharmacist or can she just stay on these medications

## 2022-08-02 ENCOUNTER — Ambulatory Visit: Payer: BLUE CROSS/BLUE SHIELD | Attending: Interventional Cardiology | Admitting: Student

## 2022-08-02 DIAGNOSIS — E782 Mixed hyperlipidemia: Secondary | ICD-10-CM

## 2022-08-02 NOTE — Progress Notes (Signed)
Patient ID: JACOYA BAUMAN                 DOB: 1961/07/25                    MRN: [id number]      HPI: Jodi Richardson is a 61 y.o. female patient referred to lipid clinic by Dr.Chandrasekhar. PMH is significant for hypertension,hyperlipidemia Today patient has no acute concern. Patient had been prescribed atorvastatin 10 mg by PCP but has not started taking it. Takes ezetimibe 10 mg daily regularly and tolerating it well without side effects. In the past patient reports trying rosuvastatin 10 mg and atorvastatin 20 mg they both gave her legs cramps/ muscle stiffness, tiredness. Patient's both side family had a history of premature cardiovascular diseases. We discussed the importance of lifestyle measures (healthy diet with regular exercise) along with lipid lowering medication. Also discussed non statin medication options (PCSK9i, Nexletol, Leqvio) their effectiveness potential side effects and cost. Patient want to try atorvastatin that PCP has prescribed first before trying any of the mentioned options. Patient prefers oral over injectable medication.  Current Medications: ezetimibe 10 mg  Intolerances: Crestor 10 mg, atorvastatin 20 mg - muscle pain/legs cramps lethargic  Risk Factors: premature family history of MI, stroke, no exercise LDL goal: <100 mg/dl given premature family CVD hx can consider <70 mg/dl  Diet: do not eat consistent meals due to job end up snacking most days of the week  Breakfast- skips Lunch- salads ,fruit Dinner: hamburger, spaghetti  Once week cook - meat loaf, mac and cheese, salads  Suggest to cook at home more and eat out may be couple times of week if possible  For salads mind full of dressing, eat healthy snacks fresh fruits/ humus+ vegetables   Exercise: none Used to go to the gym before Covid  Motivated to start - suggested  5 min chair exercise with 10- 15 min walking most days of the week increase the intensity and/or length of the exercise as per  tolerability   Family History: stroke - father,sister - stroke,other sister- MI, Mother -PAD  Social History:  EtOH- none Smoking : none  Labs: last lipid lab from [facility] on 06/21/2022 LDLc 162 mg/dl Lipid Panel     Component Value Date/Time   CHOL 197 08/14/2016 1010   TRIG 96 08/14/2016 1010   HDL 66 08/14/2016 1010   CHOLHDL 3.0 08/14/2016 1010   VLDL 19 08/14/2016 1010   LDLCALC 112 (H) 08/14/2016 1010    Past Medical History:  Diagnosis Date   Glaucoma    High cholesterol    Hypertension     Current Outpatient Medications on File Prior to Visit  Medication Sig Dispense Refill   aspirin EC 81 MG tablet Take 81 mg by mouth daily. (Patient not taking: Reported on 03/13/2022)     atorvastatin (LIPITOR) 10 MG tablet Take 10 mg by mouth daily. (Patient not taking: Reported on 03/13/2022)     cholecalciferol (VITAMIN D) 1000 units tablet Take 1,000 Units by mouth daily.     estradiol (CLIMARA - DOSED IN MG/24 HR) 0.075 mg/24hr patch Place 0.075 mg onto the skin once a week.     ezetimibe (ZETIA) 10 MG tablet Take 1 tablet (10 mg total) by mouth daily. 90 tablet 3   latanoprost (XALATAN) 0.005 % ophthalmic solution Place 1 drop into both eyes daily.     losartan-hydrochlorothiazide (HYZAAR) 50-12.5 MG tablet Take 1 tablet by mouth daily.  metoprolol succinate (TOPROL-XL) 25 MG 24 hr tablet Take 1 tablet by mouth once daily 90 tablet 3   Multiple Vitamin (MULTIVITAMIN) tablet Take 1 tablet by mouth daily.     naproxen (NAPROSYN) 375 MG tablet Take 1 tablet (375 mg total) by mouth 2 (two) times daily with a meal. (Patient not taking: Reported on 03/13/2022) 60 tablet 1   traZODone (DESYREL) 50 MG tablet Take 50 mg by mouth as needed.     vitamin B-12 (CYANOCOBALAMIN) 1000 MCG tablet Take 1,000 mcg by mouth daily.     No current facility-administered medications on file prior to visit.    No Known Allergies   Mixed hyperlipidemia Assessment/Plan:  LDLc 162 mg/dl (06/21/2022)  uncontrolled. LDL goal: <100 mg/dl given premature family CVD hx can consider <70 mg/dl Currently on ezetimibe 10 mg daily tolerating well without side effects  Patient want try atorvastatin 10 mg daily (prescribed by PCP).In the past did not tolerated Crestor 10 mg, atorvastatin 20 mg well (muscle pain/legs cramps lethargic) Discussed non statin medication options (PCSK9i, Nexletol, Leqvio) their effectiveness potential side effects and cost.  Patient want to try moderate intensity statin with ezetimibe first and repeat the lipid lab in 12 weeks  Follow up in 3 months has been scheduled with PharmD    Thank you,  Cammy Copa, Pharm.D [facility] of [facility] [place] [address] [place], [place] [address]  Phone: [phone]; Fax: [phone]

## 2022-08-02 NOTE — Assessment & Plan Note (Addendum)
Assessment/Plan:  LDLc 162 mg/dl (90/24/0973) uncontrolled. LDL goal: <100 mg/dl given premature family CVD hx can consider <70 mg/dl Currently on ezetimibe 10 mg daily tolerating well without side effects  Patient want try atorvastatin 10 mg daily (prescribed by PCP).In the past did not tolerated Crestor 10 mg, atorvastatin 20 mg well (muscle pain/legs cramps lethargic) Discussed non statin medication options (PCSK9i, Nexletol, Leqvio) their effectiveness potential side effects and cost.  Patient want to try moderate intensity statin with ezetimibe first and repeat the lipid lab in 12 weeks  Follow up in 3 months has been scheduled with PharmD

## 2022-08-02 NOTE — Patient Instructions (Signed)
Changes made by your pharmacist Carmela Hurt, PharmD at today's visit:    Instructions  (what do you need to do) Your Notes  (what you did and when you did it)  Start taking atorvastatin 10 mg every day for month if tolerated try increase dose (alternating 20 mg daily with 10 mg daily )   2.continue taking ezetimibe 10 mg daily    3. Start doing 5 min chair exercise with 10- 15 min walking most days of the week       If you have any questions or concerns please use My Chart to send questions or call the office at [phone]

## 2022-08-10 DIAGNOSIS — M542 Cervicalgia: Secondary | ICD-10-CM | POA: Diagnosis not present

## 2022-09-03 ENCOUNTER — Telehealth: Payer: Self-pay | Admitting: *Deleted

## 2022-09-03 NOTE — Telephone Encounter (Signed)
Referring MD/PCP: Dr. Sherwood Gambler  Procedure: Colonoscopy  Reason/Indication:  Screening  Has patient had this procedure before?  no  If so, when, by whom and where?    Is there a family history of colon cancer?  no  Who?  What age when diagnosed?    Is patient diabetic? If yes, Type 1 or Type 2   no      Does patient have prosthetic heart valve or mechanical valve?  no  Do you have a pacemaker/defibrillator?  no  Has patient ever had endocarditis/atrial fibrillation? no  Does patient use oxygen? no  Has patient had joint replacement within last 12 months?  no  Is patient constipated or do they take laxatives? yes  Does patient have a history of alcohol/drug use?  no  Have you had a stroke/heart attack last 6 mths? no  Do you take medicine for weight loss?  no  For female patients,: have you had a hysterectomy no                      are you post menopausal yes                      do you still have your menstrual cycle no  Is patient on blood thinner such as Coumadin, Plavix and/or Aspirin? no  Medications:  Current Outpatient Medications on File Prior to Visit  Medication Sig Dispense Refill   cholecalciferol (VITAMIN D) 1000 units tablet Take 1,000 Units by mouth daily.     ezetimibe (ZETIA) 10 MG tablet Take 1 tablet (10 mg total) by mouth daily. 90 tablet 3   latanoprost (XALATAN) 0.005 % ophthalmic solution Place 1 drop into both eyes daily.     losartan-hydrochlorothiazide (HYZAAR) 50-12.5 MG tablet Take 1 tablet by mouth daily.     metoprolol succinate (TOPROL-XL) 25 MG 24 hr tablet Take 1 tablet by mouth once daily 90 tablet 3   Multiple Vitamin (MULTIVITAMIN) tablet Take 1 tablet by mouth daily.     vitamin B-12 (CYANOCOBALAMIN) 1000 MCG tablet Take 1,000 mcg by mouth daily.     No current facility-administered medications on file prior to visit.     Allergies: No Known Allergies   [facility]

## 2022-09-03 NOTE — Telephone Encounter (Signed)
Any room 

## 2022-09-04 NOTE — Telephone Encounter (Signed)
LMOVM to call back 

## 2022-09-05 NOTE — Telephone Encounter (Signed)
LMOVM to call back. Letter mailed. 

## 2022-10-09 DIAGNOSIS — R928 Other abnormal and inconclusive findings on diagnostic imaging of breast: Secondary | ICD-10-CM | POA: Diagnosis not present

## 2022-10-09 DIAGNOSIS — R921 Mammographic calcification found on diagnostic imaging of breast: Secondary | ICD-10-CM | POA: Diagnosis not present

## 2022-10-09 DIAGNOSIS — R922 Inconclusive mammogram: Secondary | ICD-10-CM | POA: Diagnosis not present

## 2022-10-31 ENCOUNTER — Encounter: Payer: Self-pay | Admitting: *Deleted

## 2022-10-31 ENCOUNTER — Other Ambulatory Visit: Payer: Self-pay | Admitting: *Deleted

## 2022-10-31 DIAGNOSIS — Z1211 Encounter for screening for malignant neoplasm of colon: Secondary | ICD-10-CM

## 2022-10-31 MED ORDER — PEG 3350-KCL-NA BICARB-NACL 420 G PO SOLR: 4000.0000 mL | Freq: Once | ORAL | 0 refills | Status: AC

## 2022-10-31 NOTE — Telephone Encounter (Signed)
Pt has been scheduled for 12/06/22. Instructions mailed and prep sent to the pharmacy

## 2022-11-01 ENCOUNTER — Encounter (INDEPENDENT_AMBULATORY_CARE_PROVIDER_SITE_OTHER): Payer: Self-pay | Admitting: *Deleted

## 2022-11-01 NOTE — Telephone Encounter (Signed)
Referral completed

## 2022-11-04 ENCOUNTER — Ambulatory Visit: Payer: BLUE CROSS/BLUE SHIELD

## 2022-11-04 NOTE — Progress Notes (Deleted)
Patient ID: Jodi Richardson                 DOB: 11-30-1960                    MRN: [id number]     HPI: Jodi Richardson is a 62 y.o. female patient referred to lipid clinic by Dr Gasper Sells. PMH is significant for SVT, mild tricuspid regurgitation, HTN and HLD.  Get fhx better details Exercise now? On zetia when labs checked last sept? Did she stop atorva 10 not on med list?  Ask if wants CAC  Check Lp(a) with labs today Try rosuva 5 Bcbs comm may not cover branded meds  Current Medications: ezetimibe 43m daily Intolerances: atorvastatin 10-20mg daily, rosuvastatin 276mdaily - leg cramps, muscle stiffness, fatigue Risk Factors: family history premature CAD LDL goal: <10051mL  Diet:   Exercise:   Family History: premature CAD on both sides of the family. Father and sister with stroke, mother with PAD.  Social History: No tobacco, alcohol or drug use.  Labs: 06/21/22: TC 238, TG 80, HDL 62, LDL 162 (ezetimibe 36m60mily)  Past Medical History:  Diagnosis Date   Glaucoma    High cholesterol    Hypertension     Current Outpatient Medications on File Prior to Visit  Medication Sig Dispense Refill   cholecalciferol (VITAMIN D) 1000 units tablet Take 1,000 Units by mouth daily.     ezetimibe (ZETIA) 10 MG tablet Take 1 tablet (10 mg total) by mouth daily. 90 tablet 3   latanoprost (XALATAN) 0.005 % ophthalmic solution Place 1 drop into both eyes daily.     losartan-hydrochlorothiazide (HYZAAR) 50-12.5 MG tablet Take 1 tablet by mouth daily.     metoprolol succinate (TOPROL-XL) 25 MG 24 hr tablet Take 1 tablet by mouth once daily 90 tablet 3   Multiple Vitamin (MULTIVITAMIN) tablet Take 1 tablet by mouth daily.     vitamin B-12 (CYANOCOBALAMIN) 1000 MCG tablet Take 1,000 mcg by mouth daily.     No current facility-administered medications on file prior to visit.    No Known Allergies  Assessment/Plan:  1. Hyperlipidemia -

## 2022-11-13 DIAGNOSIS — N958 Other specified menopausal and perimenopausal disorders: Secondary | ICD-10-CM | POA: Diagnosis not present

## 2022-11-13 DIAGNOSIS — Z780 Asymptomatic menopausal state: Secondary | ICD-10-CM | POA: Diagnosis not present

## 2022-11-13 DIAGNOSIS — N9089 Other specified noninflammatory disorders of vulva and perineum: Secondary | ICD-10-CM | POA: Diagnosis not present

## 2022-11-13 DIAGNOSIS — Z01419 Encounter for gynecological examination (general) (routine) without abnormal findings: Secondary | ICD-10-CM | POA: Diagnosis not present

## 2022-11-21 ENCOUNTER — Encounter: Payer: Self-pay | Admitting: Radiology

## 2022-11-25 ENCOUNTER — Other Ambulatory Visit (HOSPITAL_COMMUNITY)
Admission: RE | Admit: 2022-11-25 | Discharge: 2022-11-25 | Disposition: A | Discharge status: Home / Self Care | Payer: BLUE CROSS/BLUE SHIELD | Source: Ambulatory Visit | Attending: Gastroenterology | Admitting: Gastroenterology

## 2022-11-25 DIAGNOSIS — Z1211 Encounter for screening for malignant neoplasm of colon: Secondary | ICD-10-CM | POA: Insufficient documentation

## 2022-11-25 LAB — BASIC METABOLIC PANEL
Anion gap: 9 (ref 5–15)
BUN: 19 mg/dL (ref 8–23)
CO2: 25 mmol/L (ref 22–32)
Calcium: 9.1 mg/dL (ref 8.9–10.3)
Chloride: 103 mmol/L (ref 98–111)
Creatinine, Ser: 0.9 mg/dL (ref 0.44–1.00)
GFR, Estimated: >60 mL/min (ref >60)
Glucose, Bld: 98 mg/dL (ref 70–99)
Potassium: 4.3 mmol/L (ref 3.5–5.1)
Sodium: 137 mmol/L (ref 135–145)

## 2022-11-27 DIAGNOSIS — H401222 Low-tension glaucoma, left eye, moderate stage: Secondary | ICD-10-CM | POA: Diagnosis not present

## 2022-11-27 DIAGNOSIS — H5340 Unspecified visual field defects: Secondary | ICD-10-CM | POA: Diagnosis not present

## 2022-12-04 ENCOUNTER — Telehealth (INDEPENDENT_AMBULATORY_CARE_PROVIDER_SITE_OTHER): Payer: Self-pay | Admitting: Gastroenterology

## 2022-12-04 NOTE — Telephone Encounter (Signed)
Pt called in and was asking if she could have hard candy or mints when getting ready for TCS 12/06/22. Advised pt she could as long as they are not red,blue, or purple. Pt also states that she is using a steroid cream near genital area and was wondering if she could still apply. Advised pt to hold off of applying until after procedure.

## 2022-12-06 ENCOUNTER — Telehealth (INDEPENDENT_AMBULATORY_CARE_PROVIDER_SITE_OTHER): Payer: Self-pay | Admitting: Gastroenterology

## 2022-12-06 ENCOUNTER — Telehealth (INDEPENDENT_AMBULATORY_CARE_PROVIDER_SITE_OTHER): Payer: Self-pay | Admitting: *Deleted

## 2022-12-06 ENCOUNTER — Encounter (INDEPENDENT_AMBULATORY_CARE_PROVIDER_SITE_OTHER): Payer: Self-pay | Admitting: Gastroenterology

## 2022-12-06 DIAGNOSIS — Z1211 Encounter for screening for malignant neoplasm of colon: Secondary | ICD-10-CM

## 2022-12-06 NOTE — Telephone Encounter (Signed)
Pt left voicemail stating that she needed a Friday and not Wednesday (pt procedure was rescheduled this morning due to not able to handle prep)  Contacted pt and advised her that her procedure date was on Friday (4/19) but she would need to begin prep on Wednesday. Pt states the 19th will not work for her. Pt has been rescheduled to 4/12 and instructions have been updated and placed with Clinpiq sample at front desk.

## 2022-12-06 NOTE — Telephone Encounter (Signed)
Pt unable to tolerate prep for procedure today and needs to be rescheduled. Looks like she was given trilyte.    CALLED PT. She has been rescheduled to 4/19 at 10am. She requested a Friday in April. Aware will leave clenpiq sample with instructions at [address]. She stated it was just the amount she had to drink that made her sick.   Checked carlon also and no PA required.  Secure chat sent to carolyn also informing of change

## 2022-12-23 ENCOUNTER — Telehealth (INDEPENDENT_AMBULATORY_CARE_PROVIDER_SITE_OTHER): Payer: Self-pay | Admitting: Gastroenterology

## 2022-12-23 NOTE — Telephone Encounter (Signed)
Pt called in regards to time to be at hospital for TCS on 01/03/23. Pt appt desk states pre op is 4/17 via telephone. Will send message to pre op staff to clarify and will call pt back.

## 2022-12-24 NOTE — Telephone Encounter (Signed)
Pt left voicemail returning call. Returned call to patient. Pt states she needs her pre op time changed to 11 or so because she will be on her job. Gave pt number to pre op to change time.

## 2022-12-24 NOTE — Telephone Encounter (Signed)
Clarified pre op with hospital. Pre op appt is scheduled for 01/01/23 via telephone 10:00am. Left message for pt to return call

## 2022-12-30 ENCOUNTER — Other Ambulatory Visit (HOSPITAL_COMMUNITY)
Admission: RE | Admit: 2022-12-30 | Discharge: 2022-12-30 | Disposition: A | Discharge status: Home / Self Care | Payer: BLUE CROSS/BLUE SHIELD | Source: Ambulatory Visit | Attending: Gastroenterology | Admitting: Gastroenterology

## 2022-12-30 ENCOUNTER — Telehealth (INDEPENDENT_AMBULATORY_CARE_PROVIDER_SITE_OTHER): Payer: Self-pay | Admitting: Gastroenterology

## 2022-12-30 DIAGNOSIS — Z1211 Encounter for screening for malignant neoplasm of colon: Secondary | ICD-10-CM

## 2022-12-30 LAB — BASIC METABOLIC PANEL
Anion gap: 8 (ref 5–15)
BUN: 16 mg/dL (ref 8–23)
CO2: 26 mmol/L (ref 22–32)
Calcium: 9.4 mg/dL (ref 8.9–10.3)
Chloride: 105 mmol/L (ref 98–111)
Creatinine, Ser: 0.84 mg/dL (ref 0.44–1.00)
GFR, Estimated: >60 mL/min (ref >60)
Glucose, Bld: 70 mg/dL (ref 70–99)
Potassium: 3.9 mmol/L (ref 3.5–5.1)
Sodium: 139 mmol/L (ref 135–145)

## 2022-12-30 NOTE — Telephone Encounter (Signed)
Pt left voicemail in regards to what time to be at hospital Friday for colonoscopy.  Contacted pt and made her aware of telephone pre op appt on Wednesday via phone and informed pt pre op will let her know exact arrival time. Pt verbalized understanding

## 2023-01-01 ENCOUNTER — Other Ambulatory Visit: Payer: Self-pay

## 2023-01-01 ENCOUNTER — Encounter (HOSPITAL_COMMUNITY): Payer: Self-pay

## 2023-01-01 ENCOUNTER — Encounter (HOSPITAL_COMMUNITY)
Admission: RE | Admit: 2023-01-01 | Discharge: 2023-01-01 | Disposition: A | Discharge status: Home / Self Care | Payer: BLUE CROSS/BLUE SHIELD | Source: Ambulatory Visit | Attending: Gastroenterology | Admitting: Gastroenterology

## 2023-01-01 HISTORY — DX: Tachycardia, unspecified: R00.0

## 2023-01-02 ENCOUNTER — Telehealth (INDEPENDENT_AMBULATORY_CARE_PROVIDER_SITE_OTHER): Payer: Self-pay | Admitting: *Deleted

## 2023-01-02 NOTE — Telephone Encounter (Signed)
Jodi Richardson not sure if I forwarded this  message on the phone or deleted it so I'm typing it up to make sure you get it. Pt left vm that her procedure tomorrow is being moved up from 6:40 to 6:15 and had questions about her prep. Does she follow the same directions since procedure being moved up.

## 2023-01-02 NOTE — Telephone Encounter (Signed)
Pt contacted. Pt states the nurse at hospital told her she could start prep an hour early since they were moving procedure time up. Pt advised to follow directions from hospital.

## 2023-01-03 ENCOUNTER — Ambulatory Visit (HOSPITAL_COMMUNITY)
Admission: RE | Admit: 2023-01-03 | Discharge: 2023-01-03 | Disposition: A | Discharge status: Home / Self Care | Payer: BLUE CROSS/BLUE SHIELD | Source: Ambulatory Visit | Attending: Gastroenterology | Admitting: Gastroenterology

## 2023-01-03 ENCOUNTER — Ambulatory Visit (HOSPITAL_COMMUNITY): Payer: BLUE CROSS/BLUE SHIELD | Admitting: Certified Registered Nurse Anesthetist

## 2023-01-03 ENCOUNTER — Encounter (HOSPITAL_COMMUNITY): Payer: Self-pay | Admitting: Gastroenterology

## 2023-01-03 ENCOUNTER — Encounter (HOSPITAL_COMMUNITY)
Admission: RE | Disposition: A | Discharge status: Home / Self Care | Payer: Self-pay | Source: Ambulatory Visit | Attending: Gastroenterology

## 2023-01-03 DIAGNOSIS — D120 Benign neoplasm of cecum: Secondary | ICD-10-CM | POA: Diagnosis not present

## 2023-01-03 DIAGNOSIS — Z1211 Encounter for screening for malignant neoplasm of colon: Secondary | ICD-10-CM

## 2023-01-03 DIAGNOSIS — E785 Hyperlipidemia, unspecified: Secondary | ICD-10-CM | POA: Diagnosis not present

## 2023-01-03 DIAGNOSIS — Z79899 Other long term (current) drug therapy: Secondary | ICD-10-CM | POA: Diagnosis not present

## 2023-01-03 DIAGNOSIS — E7800 Pure hypercholesterolemia, unspecified: Secondary | ICD-10-CM | POA: Insufficient documentation

## 2023-01-03 DIAGNOSIS — K5730 Diverticulosis of large intestine without perforation or abscess without bleeding: Secondary | ICD-10-CM | POA: Diagnosis not present

## 2023-01-03 DIAGNOSIS — K648 Other hemorrhoids: Secondary | ICD-10-CM | POA: Diagnosis not present

## 2023-01-03 DIAGNOSIS — K635 Polyp of colon: Secondary | ICD-10-CM | POA: Diagnosis not present

## 2023-01-03 DIAGNOSIS — I10 Essential (primary) hypertension: Secondary | ICD-10-CM | POA: Insufficient documentation

## 2023-01-03 DIAGNOSIS — D122 Benign neoplasm of ascending colon: Secondary | ICD-10-CM | POA: Insufficient documentation

## 2023-01-03 HISTORY — PX: COLONOSCOPY WITH PROPOFOL: SHX5780

## 2023-01-03 HISTORY — PX: POLYPECTOMY: SHX5525

## 2023-01-03 LAB — HM COLONOSCOPY

## 2023-01-03 SURGERY — COLONOSCOPY WITH PROPOFOL
Anesthesia: General

## 2023-01-03 MED ORDER — LACTATED RINGERS IV SOLN: INTRAVENOUS | Status: DC

## 2023-01-03 MED ORDER — PROPOFOL 500 MG/50ML IV EMUL
INTRAVENOUS | Status: AC
  Filled 2023-01-03: qty 200

## 2023-01-03 MED ORDER — PROPOFOL 1000 MG/100ML IV EMUL
INTRAVENOUS | Status: AC
  Filled 2023-01-03: qty 400

## 2023-01-03 MED ORDER — LIDOCAINE HCL (CARDIAC) PF 100 MG/5ML IV SOSY
PREFILLED_SYRINGE | INTRAVENOUS | Status: DC | PRN
  Administered 2023-01-03: 60 mg via INTRATRACHEAL

## 2023-01-03 MED ORDER — LACTATED RINGERS IV SOLN: INTRAVENOUS | Status: DC | PRN

## 2023-01-03 MED ORDER — PROPOFOL 10 MG/ML IV BOLUS
INTRAVENOUS | Status: DC | PRN
  Administered 2023-01-03: 100 mg via INTRAVENOUS
  Administered 2023-01-03: 20 mg via INTRAVENOUS

## 2023-01-03 MED ORDER — PROPOFOL 500 MG/50ML IV EMUL
INTRAVENOUS | Status: DC | PRN
  Administered 2023-01-03: 150 ug/kg/min via INTRAVENOUS

## 2023-01-03 NOTE — Anesthesia Postprocedure Evaluation (Signed)
Anesthesia Post Note  Patient: Jodi Richardson  Procedure(s) Performed: COLONOSCOPY WITH PROPOFOL POLYPECTOMY  Patient location during evaluation: Phase II Anesthesia Type: General Level of consciousness: awake Pain management: pain level controlled Vital Signs Assessment: post-procedure vital signs reviewed and stable Respiratory status: spontaneous breathing and respiratory function stable Cardiovascular status: blood pressure returned to baseline and stable Postop Assessment: no headache and no apparent nausea or vomiting Anesthetic complications: no Comments: Late entry   No notable events documented.   Last Vitals:  Vitals:   01/03/23 0652 01/03/23 0758  BP: 132/78 (!) 108/95  Pulse: 71 69  Resp: 18 14  Temp: 37.1 C (!) 36.4 C  SpO2: 100% 99%    Last Pain:  Vitals:   01/03/23 0800  TempSrc:   PainSc: 0-No pain                 Windell [place]

## 2023-01-03 NOTE — Op Note (Signed)
[facility] Patient Name: Jodi Richardson Procedure Date: 01/03/2023 7:12 AM MRN: [id number] Date of Birth: Jul 27, 1961 Attending MD: Katrinka Blazing , , [id number] CSN: [id number] Age: 62 Admit Type: Outpatient Procedure:                Colonoscopy Indications:              Screening for colorectal malignant neoplasm Providers:                Katrinka Blazing, Nena Polio, RN, Burke Keels,                            Technician Referring MD:              Medicines:                Monitored Anesthesia Care Complications:            No immediate complications. Estimated Blood Loss:     Estimated blood loss: none. Procedure:                Pre-Anesthesia Assessment:                           - Prior to the procedure, a History and Physical                            was performed, and patient medications, allergies                            and sensitivities were reviewed. The patient's                            tolerance of previous anesthesia was reviewed.                           - The risks and benefits of the procedure and the                            sedation options and risks were discussed with the                            patient. All questions were answered and informed                            consent was obtained.                           - ASA Grade Assessment: II - A patient with mild                            systemic disease.                           After obtaining informed consent, the colonoscope                            was passed under direct vision. Throughout the  procedure, the patient's blood pressure, pulse, and                            oxygen saturations were monitored continuously. The                            [id number]-HQ190L ([id number]) scope was introduced through                            the anus and advanced to the the cecum, identified                            by appendiceal orifice and ileocecal valve. The                             colonoscopy was performed without difficulty. The                            patient tolerated the procedure well. The quality                            of the bowel preparation was good. Scope In: 7:38:07 AM Scope Out: 7:53:22 AM Scope Withdrawal Time: 0 hours 10 minutes 58 seconds  Total Procedure Duration: 0 hours 15 minutes 15 seconds  Findings:      The perianal and digital rectal examinations were normal.      A 5 mm polyp was found in the cecum. The polyp was sessile. The polyp       was removed with a cold snare. Resection and retrieval were complete.      A 1 mm polyp was found in the ascending colon. The polyp was sessile.       The polyp was removed with a cold biopsy forceps. Resection and       retrieval were complete.      Multiple large-mouthed and small-mouthed diverticula were found in the       sigmoid colon and descending colon.      Non-bleeding internal hemorrhoids were found during retroflexion. The       hemorrhoids were small. Impression:               - One 5 mm polyp in the cecum, removed with a cold                            snare. Resected and retrieved.                           - One 1 mm polyp in the ascending colon, removed                            with a cold biopsy forceps. Resected and retrieved.                           - Diverticulosis in the sigmoid colon and in the  descending colon.                           - Non-bleeding internal hemorrhoids. Moderate Sedation:      Per Anesthesia Care Recommendation:           - Discharge patient to home (ambulatory).                           - Resume previous diet.                           - Await pathology results.                           - Repeat colonoscopy for surveillance based on                            pathology results. Procedure Code(s):        --- Professional ---                           [id number], Colonoscopy, flexible; with removal  of                            tumor(s), polyp(s), or other lesion(s) by snare                            technique                           45380, 59, Colonoscopy, flexible; with biopsy,                            single or multiple Diagnosis Code(s):        --- Professional ---                           Z12.11, Encounter for screening for malignant                            neoplasm of colon                           D12.0, Benign neoplasm of cecum                           D12.2, Benign neoplasm of ascending colon                           K64.8, Other hemorrhoids                           K57.30, Diverticulosis of large intestine without                            perforation or abscess without bleeding CPT copyright 2022 American Medical Association. All rights reserved. The codes documented in this report are preliminary and upon coder  review may  be revised to meet current compliance requirements. Katrinka Blazing, MD Katrinka Blazing,  01/03/2023 8:01:56 AM This report has been signed electronically. Number of Addenda: 0

## 2023-01-03 NOTE — H&P (Signed)
Jodi Richardson is an 62 y.o. female.   Chief Complaint: Screening colonoscopy  HPI: 61 y/o with PMH HLD, HTN, glaucoma, coming for screening colonoscopy. The patient has never had a colonoscopy in the past.  The patient denies having any complaints such as melena, hematochezia, abdominal pain or distention, change in her bowel movement consistency or frequency, no changes in weight recently.  No family history of colorectal cancer.   Past Medical History:  Diagnosis Date   Glaucoma    High cholesterol    Hypertension    Tachycardia     Past Surgical History:  Procedure Laterality Date   NO PAST SURGERIES     none      Family History  Problem Relation Age of Onset   High blood pressure Mother    High blood pressure Father    High blood pressure Sister    Diabetes Sister    High blood pressure Brother    Diabetes Brother    Social History:  reports that she has never smoked. She has never used smokeless tobacco. She reports that she does not drink alcohol and does not use drugs.  Allergies: No Known Allergies  Medications Prior to Admission  Medication Sig Dispense Refill   atorvastatin (LIPITOR) 10 MG tablet Take 10 mg by mouth daily.     Cholecalciferol (VITAMIN D3) 25 MCG (1000 UT) CHEW Chew 2,000 Units by mouth daily.     clobetasol ointment (TEMOVATE) 0.05 % Apply 1 Application topically at bedtime.     ezetimibe (ZETIA) 10 MG tablet Take 1 tablet (10 mg total) by mouth daily. 90 tablet 3   hydrOXYzine (ATARAX) 10 MG tablet Take 10 mg by mouth at bedtime.     latanoprost (XALATAN) 0.005 % ophthalmic solution Place 1 drop into both eyes at bedtime.     losartan-hydrochlorothiazide (HYZAAR) 50-12.5 MG tablet Take 1 tablet by mouth daily.     metoprolol succinate (TOPROL-XL) 25 MG 24 hr tablet Take 1 tablet by mouth once daily 90 tablet 3   Multiple Vitamin (MULTIVITAMIN) tablet Take 1 tablet by mouth daily.     vitamin B-12 (CYANOCOBALAMIN) 1000 MCG tablet Take 1,000 mcg  by mouth daily.      No results found for this or any previous visit (from the past 48 hour(s)). No results found.  Review of Systems  All other systems reviewed and are negative.   Blood pressure 132/78, pulse 71, temperature 98.7 F (37.1 C), temperature source Oral, resp. rate 18, SpO2 100 %. Physical Exam  GENERAL: The patient is AO x3, in no acute distress. HEENT: Head is normocephalic and atraumatic. EOMI are intact. Mouth is well hydrated and without lesions. NECK: Supple. No masses LUNGS: Clear to auscultation. No presence of rhonchi/wheezing/rales. Adequate chest expansion HEART: RRR, normal s1 and s2. ABDOMEN: Soft, nontender, no guarding, no peritoneal signs, and nondistended. BS +. No masses. EXTREMITIES: Without any cyanosis, clubbing, rash, lesions or edema. NEUROLOGIC: AOx3, no focal motor deficit. SKIN: no jaundice, no rashes  Assessment/Plan  62 y/o with PMH HLD, HTN, glaucoma, coming for screening colonoscopy. The patient is at average risk for colorectal cancer.  We will proceed with colonoscopy today.   Dolores Frame, MD 01/03/2023, 7:29 AM

## 2023-01-03 NOTE — Anesthesia Preprocedure Evaluation (Signed)
Anesthesia Evaluation  Patient identified by MRN, date of birth, ID band Patient awake    Reviewed: Allergy & Precautions, H&P , NPO status , Patient's Chart, lab work & pertinent test results, reviewed documented beta blocker date and time   Airway Mallampati: II  TM Distance: >3 FB Neck ROM: full    Dental no notable dental hx.    Pulmonary neg pulmonary ROS   Pulmonary exam normal breath sounds clear to auscultation       Cardiovascular Exercise Tolerance: Good hypertension, negative cardio ROS  Rhythm:regular Rate:Normal     Neuro/Psych negative neurological ROS  negative psych ROS   GI/Hepatic negative GI ROS, Neg liver ROS,,,  Endo/Other  negative endocrine ROS    Renal/GU negative Renal ROS  negative genitourinary   Musculoskeletal   Abdominal   Peds  Hematology negative hematology ROS (+)   Anesthesia Other Findings   Reproductive/Obstetrics negative OB ROS                             Anesthesia Physical Anesthesia Plan  ASA: 2  Anesthesia Plan: General   Post-op Pain Management:    Induction:   PONV Risk Score and Plan: Propofol infusion  Airway Management Planned:   Additional Equipment:   Intra-op Plan:   Post-operative Plan:   Informed Consent: I have reviewed the patients History and Physical, chart, labs and discussed the procedure including the risks, benefits and alternatives for the proposed anesthesia with the patient or authorized representative who has indicated his/her understanding and acceptance.     Dental Advisory Given  Plan Discussed with: CRNA  Anesthesia Plan Comments:        Anesthesia Quick Evaluation  

## 2023-01-03 NOTE — Discharge Instructions (Signed)
You are being discharged to home.  Resume your previous diet.  We are waiting for your pathology results.  Your physician has recommended a repeat colonoscopy for surveillance based on pathology results.  

## 2023-01-03 NOTE — Transfer of Care (Addendum)
Immediate Anesthesia Transfer of Care Note  Patient: Jodi Richardson  Procedure(s) Performed: COLONOSCOPY WITH PROPOFOL POLYPECTOMY  Patient Location: Endoscopy Unit  Anesthesia Type:General  Level of Consciousness: awake, alert , and oriented  Airway & Oxygen Therapy: Patient Spontanous Breathing  Post-op Assessment: Report given to RN and Post -op Vital signs reviewed and stable  Post vital signs: Reviewed and stable  Last Vitals:  Vitals Value Taken Time  BP 108/95 01/03/23 0758  Temp 36.4 C 01/03/23 0758  Pulse 69 01/03/23 0758  Resp 14 01/03/23 0758  SpO2 99 % 01/03/23 0758    Last Pain:  Vitals:   01/03/23 0652  TempSrc: Oral  PainSc: 0-No pain      Patients Stated Pain Goal: 5 (01/03/23 7106)  Complications: No notable events documented.

## 2023-01-06 ENCOUNTER — Encounter (INDEPENDENT_AMBULATORY_CARE_PROVIDER_SITE_OTHER): Payer: Self-pay | Admitting: *Deleted

## 2023-01-06 LAB — SURGICAL PATHOLOGY

## 2023-01-08 ENCOUNTER — Other Ambulatory Visit (HOSPITAL_COMMUNITY): Payer: BLUE CROSS/BLUE SHIELD

## 2023-01-09 ENCOUNTER — Encounter (HOSPITAL_COMMUNITY): Payer: Self-pay | Admitting: Gastroenterology

## 2023-01-14 DIAGNOSIS — R300 Dysuria: Secondary | ICD-10-CM | POA: Diagnosis not present

## 2023-01-14 DIAGNOSIS — N9089 Other specified noninflammatory disorders of vulva and perineum: Secondary | ICD-10-CM | POA: Diagnosis not present

## 2023-01-14 DIAGNOSIS — N958 Other specified menopausal and perimenopausal disorders: Secondary | ICD-10-CM | POA: Diagnosis not present

## 2023-03-04 ENCOUNTER — Encounter: Payer: BLUE CROSS/BLUE SHIELD | Admitting: Orthopaedic Surgery

## 2023-03-18 ENCOUNTER — Encounter: Payer: BLUE CROSS/BLUE SHIELD | Admitting: Orthopaedic Surgery

## 2023-04-22 ENCOUNTER — Other Ambulatory Visit: Payer: Self-pay | Admitting: Internal Medicine

## 2023-04-22 DIAGNOSIS — E782 Mixed hyperlipidemia: Secondary | ICD-10-CM

## 2023-05-19 ENCOUNTER — Other Ambulatory Visit: Payer: Self-pay | Admitting: Internal Medicine

## 2023-05-19 DIAGNOSIS — E782 Mixed hyperlipidemia: Secondary | ICD-10-CM

## 2023-05-29 DIAGNOSIS — H401222 Low-tension glaucoma, left eye, moderate stage: Secondary | ICD-10-CM | POA: Diagnosis not present

## 2023-06-01 ENCOUNTER — Other Ambulatory Visit: Payer: Self-pay | Admitting: Cardiology

## 2023-06-11 ENCOUNTER — Other Ambulatory Visit (INDEPENDENT_AMBULATORY_CARE_PROVIDER_SITE_OTHER): Payer: Self-pay

## 2023-06-11 ENCOUNTER — Ambulatory Visit: Payer: BLUE CROSS/BLUE SHIELD | Admitting: Orthopedic Surgery

## 2023-06-11 ENCOUNTER — Encounter: Payer: Self-pay | Admitting: Orthopedic Surgery

## 2023-06-11 VITALS — BP 129/75 | HR 61 | Ht 65.0 in | Wt 179.0 lb

## 2023-06-11 DIAGNOSIS — M79672 Pain in left foot: Secondary | ICD-10-CM

## 2023-06-11 DIAGNOSIS — M2042 Other hammer toe(s) (acquired), left foot: Secondary | ICD-10-CM

## 2023-06-11 DIAGNOSIS — M19072 Primary osteoarthritis, left ankle and foot: Secondary | ICD-10-CM

## 2023-06-11 DIAGNOSIS — M76822 Posterior tibial tendinitis, left leg: Secondary | ICD-10-CM | POA: Diagnosis not present

## 2023-06-11 DIAGNOSIS — M21612 Bunion of left foot: Secondary | ICD-10-CM

## 2023-06-11 NOTE — Progress Notes (Unsigned)
New Patient Visit  Assessment: Jodi Richardson is a 62 y.o. female with the following: 1. Pain in left foot 2. Insufficiency of left posterior tibial tendon 3. Arthritis of left midfoot 4. Bunion of great toe of left foot 5. Hammer toe of left foot   Plan: Jodi Richardson has left foot pain.  Radiographs and physical exam demonstrate several contributing factors.  She has a bunion deformity, multiple hammer toes, a flatfoot and mid foot OA.  We reviewed each of these factors in clinic.  If the pain is intolerable, consider referral to podiatrist or Dr. Lajoyce Richardson, depending on specific complaint.  Otherwise, medicines as needed.  Consider orthotics and shoes with good stability are important.  She states understanding.  Follow up as needed.   Follow-up: Return if symptoms worsen or fail to improve.  Subjective:  Chief Complaint  Patient presents with   Foot Pain    Left foot pain bad at times on the top of the foot and sometimes on the bottom feels like her arch is falling and hurts laying in bed and toes ache    History of Present Illness: Jodi Richardson is a 62 y.o. female who presents for evaluation of left foot pain.  She has had progressively worsening pain in her foot for a while.  She notes aching pains on the dorsum of the foot.  She has pain in her toes and is aware that she has a bunion.  She also has aching pain to the medial arch area.  She states that she has a flat foot.  No prior treatments.  Occasional medications.  No arch support in her foot wear.    Review of Systems: No fevers or chills No numbness or tingling No chest pain No shortness of breath No bowel or bladder dysfunction No GI distress No headaches   Medical History:  Past Medical History:  Diagnosis Date   Glaucoma    High cholesterol    Hypertension    Tachycardia     Past Surgical History:  Procedure Laterality Date   COLONOSCOPY WITH PROPOFOL N/A 01/03/2023   Procedure: COLONOSCOPY WITH  PROPOFOL;  Surgeon: Dolores Frame, MD;  Location: [facility];  Service: Gastroenterology;  Laterality: N/A;  8:45 AM, ASA 2   NO PAST SURGERIES     none     POLYPECTOMY  01/03/2023   Procedure: POLYPECTOMY;  Surgeon: Marguerita Merles, Reuel Boom, MD;  Location: [facility];  Service: Gastroenterology;;    Family History  Problem Relation Age of Onset   High blood pressure Mother    High blood pressure Father    High blood pressure Sister    Diabetes Sister    High blood pressure Brother    Diabetes Brother    Social History   Tobacco Use   Smoking status: Never   Smokeless tobacco: Never  Vaping Use   Vaping status: Never Used  Substance Use Topics   Alcohol use: No   Drug use: No    No Known Allergies  Current Meds  Medication Sig   atorvastatin (LIPITOR) 10 MG tablet Take 10 mg by mouth daily.   Cholecalciferol (VITAMIN D3) 25 MCG (1000 UT) CHEW Chew 2,000 Units by mouth daily.   clobetasol ointment (TEMOVATE) 0.05 % Apply 1 Application topically at bedtime.   ezetimibe (ZETIA) 10 MG tablet Take 1 tablet by mouth once daily   hydrOXYzine (ATARAX) 10 MG tablet Take 10 mg by mouth at bedtime.  latanoprost (XALATAN) 0.005 % ophthalmic solution Place 1 drop into both eyes at bedtime.   losartan-hydrochlorothiazide (HYZAAR) 50-12.5 MG tablet Take 1 tablet by mouth daily.   metoprolol succinate (TOPROL-XL) 25 MG 24 hr tablet Take 1 tablet by mouth once daily   Multiple Vitamin (MULTIVITAMIN) tablet Take 1 tablet by mouth daily.   vitamin B-12 (CYANOCOBALAMIN) 1000 MCG tablet Take 1,000 mcg by mouth daily.    Objective: BP 129/75   Pulse 61   Ht 5\' 5"  (1.651 m)   Wt 179 lb (81.2 kg)   BMI 29.79 kg/m   Physical Exam:  General: Alert and oriented. and No acute distress. Gait: Left sided antalgic gait.  Pes planus of left foot.  No skin breakdown.  Bunion with multiple hammer toes. No calluses on distal extent of toes.  Bunion is correctable.  Sensation  intact.   IMAGING: I personally ordered and reviewed the following images  XR of the left foot were obtained in clinic today.  No acute injuries are noted.  On the lateral, there are multiple osteophytes in the midfoot area.  There is a bunion deformity with corticated bony fragment in the medial 1st MTP.  Lesser toe deformities. No bony lesions.   Impression:  Left foot with bunion deformity and midfoot arthritis.    New Medications:  No orders of the defined types were placed in this encounter.     Jodi Barre, MD  06/11/2023 3:54 PM

## 2023-06-12 ENCOUNTER — Encounter: Payer: Self-pay | Admitting: Orthopedic Surgery

## 2023-06-27 DIAGNOSIS — M1991 Primary osteoarthritis, unspecified site: Secondary | ICD-10-CM | POA: Diagnosis not present

## 2023-06-27 DIAGNOSIS — S0006XA Insect bite (nonvenomous) of scalp, initial encounter: Secondary | ICD-10-CM | POA: Diagnosis not present

## 2023-06-27 DIAGNOSIS — E7849 Other hyperlipidemia: Secondary | ICD-10-CM | POA: Diagnosis not present

## 2023-06-27 DIAGNOSIS — Z1331 Encounter for screening for depression: Secondary | ICD-10-CM | POA: Diagnosis not present

## 2023-06-27 DIAGNOSIS — I10 Essential (primary) hypertension: Secondary | ICD-10-CM | POA: Diagnosis not present

## 2023-06-27 DIAGNOSIS — Z6827 Body mass index (BMI) 27.0-27.9, adult: Secondary | ICD-10-CM | POA: Diagnosis not present

## 2023-06-27 DIAGNOSIS — E782 Mixed hyperlipidemia: Secondary | ICD-10-CM | POA: Diagnosis not present

## 2023-06-27 DIAGNOSIS — Z0001 Encounter for general adult medical examination with abnormal findings: Secondary | ICD-10-CM | POA: Diagnosis not present

## 2023-08-04 ENCOUNTER — Ambulatory Visit: Payer: BLUE CROSS/BLUE SHIELD | Admitting: Internal Medicine

## 2023-08-04 NOTE — Progress Notes (Deleted)
Cardiology Office Note:    Date:  08/04/2023   ID:  Jodi Richardson, DOB 1961/08/05, MRN [id number]  PCP:  Elfredia Nevins, MD   [facility] Providers Cardiologist:  None     Referring MD: Elfredia Nevins, MD   CC: Follow up FH Prevention  History of Present Illness:    Jodi Richardson is a 62 y.o. female with a hx of FH, SVT (paroxysmal) and Mild TR 2017.  Saw Dr. Tenny Craw in 2017; APP in 2022. 2023: lipid clinic eval for statin myopathy Works as a Designer, industrial/product AM shift.  significant SVT.     Past Medical History:  Diagnosis Date   Glaucoma    High cholesterol    Hypertension    Tachycardia     Past Surgical History:  Procedure Laterality Date   COLONOSCOPY WITH PROPOFOL N/A 01/03/2023   Procedure: COLONOSCOPY WITH PROPOFOL;  Surgeon: Dolores Frame, MD;  Location: [facility];  Service: Gastroenterology;  Laterality: N/A;  8:45 AM, ASA 2   NO PAST SURGERIES     none     POLYPECTOMY  01/03/2023   Procedure: POLYPECTOMY;  Surgeon: Marguerita Merles, Reuel Boom, MD;  Location: [facility];  Service: Gastroenterology;;    Current Medications: No outpatient medications have been marked as taking for the 08/04/23 encounter (Appointment) with Christell Constant, MD.     Allergies:   Patient has no known allergies.   Social History   Socioeconomic History   Marital status: Married    Spouse name: Not on file   Number of children: Not on file   Years of education: Not on file   Highest education level: Not on file  Occupational History   Not on file  Tobacco Use   Smoking status: Never   Smokeless tobacco: Never  Vaping Use   Vaping status: Never Used  Substance and Sexual Activity   Alcohol use: No   Drug use: No   Sexual activity: Not on file  Other Topics Concern   Not on file  Social History Narrative   Not on file   Social Determinants of Health   Financial Resource Strain: Not on file  Food Insecurity: Not on file   Transportation Needs: Not on file  Physical Activity: Not on file  Stress: Not on file  Social Connections: Not on file     Family History: The patient's family history includes Diabetes in her brother and sister; High blood pressure in her brother, father, mother, and sister.  ROS:   Please see the history of present illness.     EKGs/Labs/Other Studies Reviewed:    The following studies were reviewed today:  Recent Labs: 12/30/2022: BUN 16; Creatinine, Ser 0.84; Potassium 3.9; Sodium 139  Recent Lipid Panel    Component Value Date/Time   CHOL 197 08/14/2016 1010   TRIG 96 08/14/2016 1010   HDL 66 08/14/2016 1010   CHOLHDL 3.0 08/14/2016 1010   VLDL 19 08/14/2016 1010   LDLCALC 112 (H) 08/14/2016 1010        Physical Exam:    VS:  There were no vitals taken for this visit.    Wt Readings from Last 3 Encounters:  06/11/23 179 lb (81.2 kg)  01/01/23 175 lb (79.4 kg)  03/13/22 173 lb (78.5 kg)    Gen: No distress   Neck: No JVD Cardiac: No Rubs or Gallops, no murmur, bradycardia, +2 radial pulses Respiratory: Clear to auscultation bilaterally, normal effort, normal  respiratory  rate GI: Soft, nontender, non-distended  MS: No edema;  moves all extremities Integument: Skin feels warm Neuro:  At time of evaluation, alert and oriented to person/place/time/situation  Psych: Normal affect, patient feels well  ASSESSMENT:    No diagnosis found.  PLAN:    Paroxysmal SVT - doing well on succinate 25 mg PO daily, we reviewed other options  HTN Mild TR - controlled on current regimen no changes - will repeat echo  FH Complicated by myalgias and fatigue on atorvastatin 5 mg  - we will try Zetia 10 mg PO daily, she is not on atorvastatin 10 mg - discussed risks and long term prevention - will check labs in 2 months     One year with me if no issues  Medication Adjustments/Labs and Tests Ordered: Current medicines are reviewed at length with the patient today.   Concerns regarding medicines are outlined above.  No orders of the defined types were placed in this encounter.  No orders of the defined types were placed in this encounter.   There are no Patient Instructions on file for this visit.   Signed, Christell Constant, MD  08/04/2023 12:42 PM    [facility]

## 2023-08-27 ENCOUNTER — Other Ambulatory Visit: Payer: Self-pay | Admitting: Internal Medicine

## 2023-09-11 DIAGNOSIS — Z1231 Encounter for screening mammogram for malignant neoplasm of breast: Secondary | ICD-10-CM | POA: Diagnosis not present

## 2023-09-16 ENCOUNTER — Ambulatory Visit: Payer: BLUE CROSS/BLUE SHIELD | Attending: Nurse Practitioner | Admitting: Nurse Practitioner

## 2023-09-16 ENCOUNTER — Encounter: Payer: Self-pay | Admitting: Nurse Practitioner

## 2023-09-16 VITALS — BP 110/68 | HR 54 | Ht 65.0 in | Wt 181.0 lb

## 2023-09-16 DIAGNOSIS — E782 Mixed hyperlipidemia: Secondary | ICD-10-CM | POA: Diagnosis not present

## 2023-09-16 DIAGNOSIS — I10 Essential (primary) hypertension: Secondary | ICD-10-CM | POA: Diagnosis not present

## 2023-09-16 DIAGNOSIS — I471 Supraventricular tachycardia, unspecified: Secondary | ICD-10-CM | POA: Diagnosis not present

## 2023-09-16 NOTE — Progress Notes (Addendum)
Cardiology Office Note:  .   Date:  09/16/2023  ID:  Jodi Richardson, DOB 13-Jun-1961, MRN [id number] PCP: Elfredia Nevins, MD  [facility] Providers Cardiologist:  Christell Constant, MD    History of Present Illness: .   Jodi Richardson is a 62 y.o. female with a PMH of PSVT, mild TR, and FH (followed by Lipid Clinic), HTN, who presents today for 1 year follow-up.   Last seen by Dr. Izora Ribas on March 13, 2022. Was overall doing well at the time. TTE 03/2022 was benign.   Today she presents for 1 year follow-up. She states she is doing well. Denies any acute cardiac complaints or issues. Denies any chest pain, shortness of breath, palpitations, syncope, presyncope, dizziness, orthopnea, PND, swelling or significant weight changes, acute bleeding, or claudication.   ROS: Negative. See HPI.  SH: Works as Consulting civil engineer   Studies Reviewed: Marland Kitchen    EKG: EKG Interpretation Date/Time:  Tuesday September 16 2023 10:06:41 EST Ventricular Rate:  54 PR Interval:  154 QRS Duration:  80 QT Interval:  420 QTC Calculation: 398 R Axis:   67  Text Interpretation: Sinus bradycardia Nonspecific ST abnormality When compared with ECG of 14-Aug-2020 19:46, PREVIOUS ECG IS PRESENT Confirmed by Sharlene Dory [id number]) on 09/16/2023 10:07:58 AM   Echo 03/2022:   1. Left ventricular ejection fraction, by estimation, is 65 to 70%. The  left ventricle has normal function. The left ventricle has no regional  wall motion abnormalities. Left ventricular diastolic parameters were  normal. The average left ventricular global longitudinal strain is -24.2 %. The global longitudinal strain is normal.   2. Right ventricular systolic function is normal. The right ventricular  size is normal. There is normal pulmonary artery systolic pressure. The estimated right ventricular systolic pressure is 21.0 mmHg.   3. The mitral valve is grossly normal. No evidence of mitral valve  regurgitation.   4. The  aortic valve is tricuspid. Aortic valve regurgitation is not  visualized.   5. The inferior vena cava is normal in size with greater than 50%  respiratory variability, suggesting right atrial pressure of 3 mmHg.   Comparison(s): Prior images unable to be directly viewed.   Cardiac monitor 09/2020:  Sinus rhythm   Heart rates 50 to 171 bpm   Average HR 81 bpm    Occasional PACs, short bursts of SVT   Longest spell of SVT was approximately 16 seconds   170 bpm   Not sensed   Occurred 1:30 AM Diary entries:  One fluttering sensation correlated with short burst SVT  Other times it was SR.  Other sensations (pressure) correlated with SR  Physical Exam:   VS:  BP 110/68   Pulse (!) 54   Ht 5\' 5"  (1.651 m)   Wt 181 lb (82.1 kg)   SpO2 98%   BMI 30.12 kg/m    Wt Readings from Last 3 Encounters:  09/16/23 181 lb (82.1 kg)  06/11/23 179 lb (81.2 kg)  01/01/23 175 lb (79.4 kg)    GEN: Well nourished, well developed in no acute distress NECK: No JVD; No carotid bruits CARDIAC: S1/S2, slow rate and regular rhythm, no murmurs, rubs, gallops RESPIRATORY:  Clear to auscultation without rales, wheezing or rhonchi  ABDOMEN: Soft, non-tender, non-distended EXTREMITIES:  No edema; No deformity   ASSESSMENT AND PLAN: .    PSVT Denies any tachycardia or palpitations. Continue Toprol XL. Heart healthy diet and regular cardiovascular exercise encouraged.   HTN  BP stable and at goal. Discussed to monitor BP at home at least 2 hours after medications and sitting for 5-10 minutes. Heart healthy diet and regular cardiovascular exercise encouraged.   Mixed HLD, FH Labs from 06/2023 revealed LDL of 114, rest of panel was WNL. No longer taking atorvastatin as she cannot tolerate, also has not been able to tolerate Crestor. Discussed PCSK9i and benefits of therapy, has a FH of familial hyperlipidemia. Discussed Lipid Clinic referral and options for medical therapy, pt requesting information on PCSK9i (given  information on Repatha) and she will let us know her decision in next few weeks via MyChart. Heart healthy diet and regular cardiovascular exercise encouraged.    Dispo: Follow-up with Dr. Izora Ribas or APP in 1 year or sooner if anything changes.   Signed, Sharlene Dory, NP

## 2023-09-16 NOTE — Patient Instructions (Addendum)

## 2023-09-18 ENCOUNTER — Ambulatory Visit: Payer: BLUE CROSS/BLUE SHIELD | Admitting: Nurse Practitioner

## 2023-11-15 ENCOUNTER — Other Ambulatory Visit: Payer: Self-pay | Admitting: Internal Medicine

## 2023-11-15 DIAGNOSIS — E782 Mixed hyperlipidemia: Secondary | ICD-10-CM

## 2023-11-17 ENCOUNTER — Other Ambulatory Visit: Payer: Self-pay

## 2023-11-17 DIAGNOSIS — E782 Mixed hyperlipidemia: Secondary | ICD-10-CM

## 2023-11-17 MED ORDER — EZETIMIBE 10 MG PO TABS
10.0000 mg | ORAL_TABLET | Freq: Every day | ORAL | 1 refills | Status: DC
Start: 2023-11-17 — End: 2024-06-16

## 2023-11-17 NOTE — Telephone Encounter (Signed)
This is a Nurse, mental health pt

## 2023-12-04 ENCOUNTER — Ambulatory Visit
Admission: EM | Admit: 2023-12-04 | Discharge: 2023-12-04 | Disposition: A | Discharge status: Home / Self Care | Attending: Nurse Practitioner | Admitting: Nurse Practitioner

## 2023-12-04 DIAGNOSIS — J111 Influenza due to unidentified influenza virus with other respiratory manifestations: Secondary | ICD-10-CM | POA: Diagnosis not present

## 2023-12-04 DIAGNOSIS — Z20828 Contact with and (suspected) exposure to other viral communicable diseases: Secondary | ICD-10-CM | POA: Diagnosis not present

## 2023-12-04 LAB — POC COVID19/FLU A&B COMBO
Covid Antigen, POC: NEGATIVE
Influenza A Antigen, POC: NEGATIVE
Influenza B Antigen, POC: NEGATIVE

## 2023-12-04 MED ORDER — PROMETHAZINE-DM 6.25-15 MG/5ML PO SYRP: 5.0000 mL | ORAL_SOLUTION | Freq: Four times a day (QID) | ORAL | 0 refills | Status: DC | PRN

## 2023-12-04 MED ORDER — OSELTAMIVIR PHOSPHATE 75 MG PO CAPS: 75.0000 mg | ORAL_CAPSULE | Freq: Two times a day (BID) | ORAL | 0 refills | Status: DC

## 2023-12-04 NOTE — ED Provider Notes (Signed)
 [facility]    CSN: [id number] Arrival date & time: 12/04/23  1507      History   Chief Complaint No chief complaint on file.   HPI COVA KNIERIEM is a 63 y.o. female.   The history is provided by the patient.   Patient presents for complaints of chills, headache, sore throat, body aches, and cough that started for the past 24 hours.  Denies fever, ear pain, ear drainage, wheezing, difficulty breathing, chest pain, abdominal pain, nausea, vomiting, diarrhea, or rash.  Patient states that she was around her family member in the hospital who was later diagnosed with influenza.  She has taken over-the-counter analgesics for her symptoms.  Past Medical History:  Diagnosis Date   Glaucoma    High cholesterol    Hypertension    Tachycardia     Patient Active Problem List   Diagnosis Date Noted   Encounter for colorectal cancer screening 01/03/2023   SVT (supraventricular tachycardia) (HCC) 03/13/2022   Mixed hyperlipidemia 03/13/2022   Mild tricuspid regurgitation 03/13/2022   Essential hypertension 03/13/2022    Past Surgical History:  Procedure Laterality Date   COLONOSCOPY WITH PROPOFOL N/A 01/03/2023   Procedure: COLONOSCOPY WITH PROPOFOL;  Surgeon: Dolores Frame, MD;  [facility];  Service: Gastroenterology;  Laterality: N/A;  8:45 AM, ASA 2   NO PAST SURGERIES     none     POLYPECTOMY  01/03/2023   Procedure: POLYPECTOMY;  Surgeon: Dolores Frame, MD;  Location: [facility];  Service: Gastroenterology;;    OB History   No obstetric history on file.      Home Medications    Prior to Admission medications   Medication Sig Start Date End Date Taking? Authorizing Provider  oseltamivir (TAMIFLU) 75 MG capsule Take 1 capsule (75 mg total) by mouth every 12 (twelve) hours. 12/04/23  Yes Leath-Warren, Sadie Haber, NP  promethazine-dextromethorphan (PROMETHAZINE-DM) 6.25-15 MG/5ML syrup Take 5 mLs by mouth 4 (four) times  daily as needed. 12/04/23  Yes Leath-Warren, Sadie Haber, NP  atorvastatin (LIPITOR) 10 MG tablet Take 10 mg by mouth daily. Patient not taking: Reported on 09/16/2023 11/09/22   [provider]  Cholecalciferol (VITAMIN D3) 25 MCG (1000 UT) CHEW Chew 2,000 Units by mouth daily.    [provider]  clobetasol ointment (TEMOVATE) 0.05 % Apply 1 Application topically at bedtime.    [provider]  ezetimibe (ZETIA) 10 MG tablet Take 1 tablet (10 mg total) by mouth daily. 11/17/23   Sharlene Dory, NP  hydrOXYzine (ATARAX) 10 MG tablet Take 10 mg by mouth at bedtime. 07/23/22   [provider]  latanoprost (XALATAN) 0.005 % ophthalmic solution Place 1 drop into both eyes at bedtime. 08/12/20   [provider]  losartan-hydrochlorothiazide (HYZAAR) 50-12.5 MG tablet Take 1 tablet by mouth daily.    [provider]  metoprolol succinate (TOPROL-XL) 25 MG 24 hr tablet Take 1 tablet by mouth once daily 08/28/23   Riley Lam A, MD  Multiple Vitamin (MULTIVITAMIN) tablet Take 1 tablet by mouth daily.    [provider]  vitamin B-12 (CYANOCOBALAMIN) 1000 MCG tablet Take 1,000 mcg by mouth daily.    [provider]    Family History Family History  Problem Relation Age of Onset   High blood pressure Mother    High blood pressure Father    High blood pressure Sister    Diabetes Sister    High blood pressure Brother  Diabetes Brother     Social History Social History   Tobacco Use   Smoking status: Never   Smokeless tobacco: Never  Vaping Use   Vaping status: Never Used  Substance Use Topics   Alcohol use: No   Drug use: No     Allergies   Patient has no known allergies.   Review of Systems Review of Systems Per HPI  Physical Exam Triage Vital Signs ED Triage Vitals [12/04/23 1625]  Encounter Vitals Group     BP (!) 153/73     Systolic BP Percentile      Diastolic BP Percentile      Pulse Rate  (!) 103     Resp 18     Temp 100 F (37.8 C)     Temp Source Oral     SpO2 91 %     Weight      Height      Head Circumference      Peak Flow      Pain Score 5     Pain Loc      Pain Education      Exclude from Growth Chart    No data found.  Updated Vital Signs BP (!) 153/73 (BP Location: Right Arm)   Pulse (!) 103   Temp 100 F (37.8 C) (Oral)   Resp 18   SpO2 91%   Visual Acuity Right Eye Distance:   Left Eye Distance:   Bilateral Distance:    Right Eye Near:   Left Eye Near:    Bilateral Near:     Physical Exam Vitals and nursing note reviewed.  Constitutional:      General: She is not in acute distress.    Appearance: Normal appearance.  HENT:     Head: Normocephalic.     Right Ear: Tympanic membrane, ear canal and external ear normal.     Left Ear: Tympanic membrane, ear canal and external ear normal.     Nose: Rhinorrhea present.     Right Turbinates: Enlarged and swollen.     Left Turbinates: Enlarged and swollen.     Right Sinus: No maxillary sinus tenderness or frontal sinus tenderness.     Left Sinus: No maxillary sinus tenderness or frontal sinus tenderness.     Mouth/Throat:     Lips: Pink.     Mouth: Mucous membranes are moist.     Pharynx: Uvula midline. Posterior oropharyngeal erythema and postnasal drip present. No pharyngeal swelling, oropharyngeal exudate or uvula swelling.  Eyes:     Extraocular Movements: Extraocular movements intact.     Conjunctiva/sclera: Conjunctivae normal.     Pupils: Pupils are equal, round, and reactive to light.  Cardiovascular:     Rate and Rhythm: Regular rhythm. Tachycardia present.     Pulses: Normal pulses.     Heart sounds: Normal heart sounds.  Pulmonary:     Effort: Pulmonary effort is normal. No respiratory distress.     Breath sounds: Normal breath sounds. No stridor. No wheezing, rhonchi or rales.  Abdominal:     General: Bowel sounds are normal.     Palpations: Abdomen is soft.     Tenderness:  There is no abdominal tenderness.  Musculoskeletal:     Cervical back: Normal range of motion.  Skin:    General: Skin is warm and dry.  Neurological:     General: No focal deficit present.     Mental Status: She is alert and oriented to person, place,  and time.  Psychiatric:        Mood and Affect: Mood normal.        Behavior: Behavior normal.      UC Treatments / Results  Labs (all labs ordered are listed, but only abnormal results are displayed) Labs Reviewed  POC COVID19/FLU A&B COMBO    EKG   Radiology No results found.  Procedures Procedures (including critical care time)  Medications Ordered in UC Medications - No data to display  Initial Impression / Assessment and Plan / UC Course  I have reviewed the triage vital signs and the nursing notes.  Pertinent labs & imaging results that were available during my care of the patient were reviewed by me and considered in my medical decision making (see chart for details).  COVID/flu test was negative.  Patient with close contact with influenza.  Patient also presents with influenza-like illness.  Given patient's current presentation and exposure, will treat with Tamiflu 75 mg twice daily for the next 5 days.  For her cough, Promethazine DM was prescribed.  Supportive care recommendations were provided and discussed with the patient to include fluids, rest, over-the-counter analgesics, warm salt water gargles, and use of a humidifier during sleep.  Discussed indications regarding follow-up.  Patient was in agreement with this plan of care and verbalizes understanding.  All questions were answered.  Patient stable for discharge.  Work note was provided.  Final Clinical Impressions(s) / UC Diagnoses   Final diagnoses:  Influenza-like illness  Exposure to influenza     Discharge Instructions      The COVID/flu test was negative.  Given your recent exposure and current symptoms, I have treated you with Tamiflu. Take  the medication as prescribed. Increase fluids and allow for plenty of rest. Continue over-the-counter analgesics such as Tylenol or ibuprofen for pain, fever, or general discomfort. Warm salt water gargles 3-4 times daily as needed for throat pain or discomfort.  Also recommend use of over-the-counter Chloraseptic throat spray and throat lozenges while symptoms persist. For your cough, it may be helpful to use a humidifier in your bedroom at nighttime during sleep and to sleep elevated on pillows while symptoms persist. If you develop fever, you should remain home until you have been fever free for 24 hours with no medication. Symptoms should improve over the next 5 to 7 days.  If symptoms fail to improve, or appear to be worsening, you may follow-up in this clinic or with your primary care physician for further evaluation. Follow-up as needed.     ED Prescriptions     Medication Sig Dispense Auth. Provider   oseltamivir (TAMIFLU) 75 MG capsule Take 1 capsule (75 mg total) by mouth every 12 (twelve) hours. 10 capsule Leath-Warren, Sadie Haber, NP   promethazine-dextromethorphan (PROMETHAZINE-DM) 6.25-15 MG/5ML syrup Take 5 mLs by mouth 4 (four) times daily as needed. 118 mL Leath-Warren, Sadie Haber, NP      PDMP not reviewed this encounter.   Abran Cantor, NP 12/04/23 1706

## 2023-12-04 NOTE — Discharge Instructions (Signed)
 The COVID/flu test was negative.  Given your recent exposure and current symptoms, I have treated you with Tamiflu. Take the medication as prescribed. Increase fluids and allow for plenty of rest. Continue over-the-counter analgesics such as Tylenol or ibuprofen for pain, fever, or general discomfort. Warm salt water gargles 3-4 times daily as needed for throat pain or discomfort.  Also recommend use of over-the-counter Chloraseptic throat spray and throat lozenges while symptoms persist. For your cough, it may be helpful to use a humidifier in your bedroom at nighttime during sleep and to sleep elevated on pillows while symptoms persist. If you develop fever, you should remain home until you have been fever free for 24 hours with no medication. Symptoms should improve over the next 5 to 7 days.  If symptoms fail to improve, or appear to be worsening, you may follow-up in this clinic or with your primary care physician for further evaluation. Follow-up as needed.

## 2023-12-04 NOTE — ED Triage Notes (Signed)
 Pt reports she has a cough, chills, headache, and sore throat x 1 day

## 2024-02-07 ENCOUNTER — Other Ambulatory Visit: Payer: Self-pay | Admitting: Internal Medicine

## 2024-02-11 ENCOUNTER — Encounter: Admitting: Orthopedic Surgery

## 2024-05-14 ENCOUNTER — Encounter: Payer: Self-pay | Admitting: Radiology

## 2024-06-07 LAB — LAB REPORT - SCANNED
A1c: 5.9
EGFR: 75

## 2024-06-16 ENCOUNTER — Other Ambulatory Visit: Payer: Self-pay | Admitting: Nurse Practitioner

## 2024-06-16 DIAGNOSIS — E782 Mixed hyperlipidemia: Secondary | ICD-10-CM

## 2024-06-25 ENCOUNTER — Telehealth: Payer: Self-pay | Admitting: Internal Medicine

## 2024-06-25 DIAGNOSIS — R072 Precordial pain: Secondary | ICD-10-CM

## 2024-06-25 NOTE — Telephone Encounter (Signed)
 Called patient with Dr. Milan advisement. Will place order for testing and will send message to [facility] office to see if she can get appointment. Will message Dr. Santo to see if patient needs to take additional metoprolol  for this CT. Patient is on Metoprolol  Succinate 25 mg, and her last office visit in December of last year her BP and HR were on the low side. BP  110/68  and HR 54. Patient stated her BP runs low, but she does not have any recent readings. Once provider response patient will need Mychart message sent of instructions.

## 2024-06-25 NOTE — Telephone Encounter (Signed)
 Called patient back about message. Patient complaining of chest pain underneath left breast that only last from 2 seconds to a minute. Patient could not describe pain and she said it was in the middle of sharp and dull. It is not continuous, and it happens off and on. Patient stated the palpitations she had was last week and only lasted for 20 minutes.Patient stated she had been having a lot of indigestion. Will send message to Dr. Santo for advisement.

## 2024-06-25 NOTE — Telephone Encounter (Signed)
 Pt c/o of Chest Pain: STAT if active (IN THIS MOMENT) CP, including tightness, pressure, jaw pain, shoulder/upper arm/back pain, SOB, nausea, and vomiting.  1. Are you having CP right now (tightness, pressure, or discomfort)?  Feels the pain underneath left breast only. No pain currently.  2. Are you experiencing any other symptoms (ex. SOB, nausea, vomiting, sweating)?  Palpitations   3. How long have you been experiencing CP?  Past few weeks + becoming more frequent  4. Is your CP continuous or coming and going?  Coming and going   5. Have you taken Nitroglycerin?  No, doesn't have any

## 2024-06-25 NOTE — Telephone Encounter (Signed)
 Dr. Santo is fine with patient having no additional metoprolol  for her Cardiac CT. Will send patient instructions through mychart. Patient will see if she can get PCP to send lab work.

## 2024-06-29 ENCOUNTER — Telehealth (INDEPENDENT_AMBULATORY_CARE_PROVIDER_SITE_OTHER): Payer: Self-pay | Admitting: Gastroenterology

## 2024-06-29 NOTE — Telephone Encounter (Signed)
 I spoke with the patient and she says she has had this issue for over a year. I advised that we would need to see her since we have not seen her since 12/2022. I transferred the patient to [place] to schedule an appointment with next available provider. Patient states understanding.

## 2024-06-29 NOTE — Telephone Encounter (Signed)
 Patient called asking to speak to the nurse. She said she has a lot of belching/burping, even on an empty stomach. I offered her appointment, but she wanted to speak with nurse first. [phone]

## 2024-07-01 ENCOUNTER — Encounter (INDEPENDENT_AMBULATORY_CARE_PROVIDER_SITE_OTHER): Payer: Self-pay | Admitting: Gastroenterology

## 2024-07-01 ENCOUNTER — Ambulatory Visit (INDEPENDENT_AMBULATORY_CARE_PROVIDER_SITE_OTHER): Admitting: Gastroenterology

## 2024-07-01 VITALS — BP 132/85 | HR 83 | Temp 97.5°F | Ht 65.0 in | Wt 174.6 lb

## 2024-07-01 DIAGNOSIS — K219 Gastro-esophageal reflux disease without esophagitis: Secondary | ICD-10-CM | POA: Diagnosis not present

## 2024-07-01 DIAGNOSIS — R142 Eructation: Secondary | ICD-10-CM

## 2024-07-01 MED ORDER — OMEPRAZOLE 40 MG PO CPDR
40.0000 mg | DELAYED_RELEASE_CAPSULE | Freq: Every day | ORAL | 1 refills | Status: AC
Start: 2024-07-01 — End: ?

## 2024-07-01 NOTE — Patient Instructions (Addendum)
 Start omeprazole 40 mg daily Explained presumed etiology of reflux symptoms. Instruction provided in the use of antireflux medication - patient should take medication in the morning 30-45 minutes before eating breakfast. Discussed avoidance of eating within 2 hours of lying down to sleep and benefit of blocks to elevate head of bed. Also, will benefit from avoiding carbonated drinks/sodas or food that has tomatoes, spicy or greasy food.

## 2024-07-01 NOTE — Progress Notes (Unsigned)
 Toribio Fortune, M.D. Gastroenterology & Hepatology [facility] [address] [place], [place] [address]  Primary Care Physician: Bertell Satterfield, MD [address] [place] [address]  I will communicate my assessment and recommendations to the referring MD via EMR.  Problems: Abdominal pain and burping  History of Present Illness: Jodi Richardson is a 63 y.o. female with past medical history of hyperlipidemia, hypertension and glucoma, who presents for evaluation of abdominal pain and burping.  Patient reports having issues with burping for the last year. States this is happening on a daily basis, mostly during the day but has been concerned as her symptoms have recently worsened and became more frequent. States belching causes some pain in her LUQ that does radiate to her back.  She also reports that she has felt  frequent recurrent problems with heartburn for the months. This has been happening every couple of day. Not taking any medication for this. No odynophagia or dysphagia.  States that 2 months ago she had an episode of nausea and vomiting when she was having a meal when eating out. States that day she had persisting burning in her throat. No more vomiting episodes.  States yesterday she had some L groin pain, which improved, but otdya she has some discomfort in her lower abdomen. Had several Bmstoday but no diarrhea.  The patient denies having any fever, chills, hematochezia, melena, hematemesis, abdominal distention, diarrhea, jaundice, pruritus or weight loss.  Last EGD: never Last Colonoscopy: 01/03/23 - One 5 mm polyp in the cecum, removed with a cold snare. Resected and retrieved. - One 1 mm polyp in the ascending colon, removed with a cold biopsy forceps. Resected and retrieved. - Diverticulosis in the sigmoid colon and in the descending colon. - Non- bleeding internal hemorrhoids.  Pathology confirmed tubular  adenoma, recommended repeat colonoscopy in 7 years.  Past Medical History: Past Medical History:  Diagnosis Date   Glaucoma    High cholesterol    Hypertension    Tachycardia     Past Surgical History: Past Surgical History:  Procedure Laterality Date   COLONOSCOPY WITH PROPOFOL  N/A 01/03/2023   Procedure: COLONOSCOPY WITH PROPOFOL ;  Surgeon: Fortune Angelia Toribio, MD;  [facility];  Service: Gastroenterology;  Laterality: N/A;  8:45 AM, ASA 2   NO PAST SURGERIES     none     POLYPECTOMY  01/03/2023   Procedure: POLYPECTOMY;  Surgeon: Fortune Angelia, Toribio, MD;  Location: [facility];  Service: Gastroenterology;;    Family History: Family History  Problem Relation Age of Onset   High blood pressure Mother    High blood pressure Father    High blood pressure Sister    Diabetes Sister    High blood pressure Brother    Diabetes Brother     Social History: Social History   Tobacco Use  Smoking Status Never  Smokeless Tobacco Never   Social History   Substance and Sexual Activity  Alcohol Use No   Social History   Substance and Sexual Activity  Drug Use No    Allergies: No Known Allergies  Medications: Current Outpatient Medications  Medication Sig Dispense Refill   Cholecalciferol (VITAMIN D3) 25 MCG (1000 UT) CHEW Chew 2,000 Units by mouth daily.     clobetasol ointment (TEMOVATE) 0.05 % Apply 1 Application topically at bedtime.     ezetimibe  (ZETIA ) 10 MG tablet Take 1 tablet (10 mg total) by mouth daily. Patient must call  and schedule annual appointment for further refills 90 tablet 1   latanoprost (XALATAN) 0.005 % ophthalmic solution Place 1 drop into both eyes at bedtime.     losartan-hydrochlorothiazide (HYZAAR) 50-12.5 MG tablet Take 1 tablet by mouth daily.     metoprolol  succinate (TOPROL -XL) 25 MG 24 hr tablet Take 1 tablet (25 mg total) by mouth daily. (Patient taking differently: Take 25 mg by mouth as needed.) 90 tablet 3    Multiple Vitamin (MULTIVITAMIN) tablet Take 1 tablet by mouth daily.     vitamin B-12 (CYANOCOBALAMIN ) 1000 MCG tablet Take 1,000 mcg by mouth daily.     atorvastatin (LIPITOR) 10 MG tablet Take 10 mg by mouth daily. (Patient not taking: Reported on 07/01/2024)     No current facility-administered medications for this visit.    Review of Systems: GENERAL: negative for malaise, night sweats HEENT: No changes in hearing or vision, no nose bleeds or other nasal problems. NECK: Negative for lumps, goiter, pain and significant neck swelling RESPIRATORY: Negative for cough, wheezing CARDIOVASCULAR: Negative for chest pain, leg swelling, palpitations, orthopnea GI: SEE HPI MUSCULOSKELETAL: Negative for joint pain or swelling, back pain, and muscle pain. SKIN: Negative for lesions, rash PSYCH: Negative for sleep disturbance, mood disorder and recent psychosocial stressors. HEMATOLOGY Negative for prolonged bleeding, bruising easily, and swollen nodes. ENDOCRINE: Negative for cold or heat intolerance, polyuria, polydipsia and goiter. NEURO: negative for tremor, gait imbalance, syncope and seizures. The remainder of the review of systems is noncontributory.   Physical Exam: BP 132/85 (BP Location: Left Arm, Patient Position: Sitting, Cuff Size: Large)   Pulse 83   Temp (!) 97.5 F (36.4 C) (Temporal)   Ht 5' 5 (1.651 m)   Wt 174 lb 9.6 oz (79.2 kg)   BMI 29.05 kg/m  GENERAL: The patient is AO x3, in no acute distress. HEENT: Head is normocephalic and atraumatic. EOMI are intact. Mouth is well hydrated and without lesions. NECK: Supple. No masses LUNGS: Clear to auscultation. No presence of rhonchi/wheezing/rales. Adequate chest expansion HEART: RRR, normal s1 and s2. ABDOMEN: Soft, nontender, no guarding, no peritoneal signs, and nondistended. BS +. No masses. RECTAL EXAM: no external lesions, normal tone, no masses, brown stool without blood.*** Chaperone: EXTREMITIES: Without any  cyanosis, clubbing, rash, lesions or edema. NEUROLOGIC: AOx3, no focal motor deficit. SKIN: no jaundice, no rashes  Imaging/Labs: as above  I personally reviewed and interpreted the available labs, imaging and endoscopic files.  Impression and Plan: Jodi Richardson is a 63 y.o. female coming for follow up of ***   All questions were answered.      Toribio Fortune, MD Gastroenterology and Hepatology [facility]

## 2024-07-03 ENCOUNTER — Other Ambulatory Visit: Payer: Self-pay

## 2024-07-03 ENCOUNTER — Emergency Department (HOSPITAL_COMMUNITY)
Admission: EM | Admit: 2024-07-03 | Discharge: 2024-07-03 | Disposition: A | Discharge status: Home / Self Care | Attending: Emergency Medicine | Admitting: Emergency Medicine

## 2024-07-03 ENCOUNTER — Emergency Department (HOSPITAL_COMMUNITY)

## 2024-07-03 DIAGNOSIS — R1030 Lower abdominal pain, unspecified: Secondary | ICD-10-CM | POA: Diagnosis present

## 2024-07-03 DIAGNOSIS — R102 Pelvic and perineal pain unspecified side: Secondary | ICD-10-CM | POA: Diagnosis not present

## 2024-07-03 LAB — URINALYSIS, ROUTINE W REFLEX MICROSCOPIC
Bilirubin Urine: NEGATIVE
Glucose, UA: NEGATIVE mg/dL
Hgb urine dipstick: NEGATIVE
Ketones, ur: NEGATIVE mg/dL
Leukocytes,Ua: NEGATIVE
Nitrite: NEGATIVE
Protein, ur: NEGATIVE mg/dL
Specific Gravity, Urine: 1.012 (ref 1.005–1.030)
pH: 5 (ref 5.0–8.0)

## 2024-07-03 LAB — COMPREHENSIVE METABOLIC PANEL WITH GFR
ALT: 24 U/L (ref 0–44)
AST: 38 U/L (ref 15–41)
Albumin: 4.2 g/dL (ref 3.5–5.0)
Alkaline Phosphatase: 101 U/L (ref 38–126)
Anion gap: 13 (ref 5–15)
BUN: 14 mg/dL (ref 8–23)
CO2: 28 mmol/L (ref 22–32)
Calcium: 9.9 mg/dL (ref 8.9–10.3)
Chloride: 102 mmol/L (ref 98–111)
Creatinine, Ser: 1.02 mg/dL — ABNORMAL HIGH (ref 0.44–1.00)
GFR, Estimated: >60 mL/min (ref >60)
Glucose, Bld: 99 mg/dL (ref 70–99)
Potassium: 4.3 mmol/L (ref 3.5–5.1)
Sodium: 143 mmol/L (ref 135–145)
Total Bilirubin: 0.3 mg/dL (ref 0.0–1.2)
Total Protein: 7.7 g/dL (ref 6.5–8.1)

## 2024-07-03 LAB — CBC
HCT: 39.3 % (ref 36.0–46.0)
Hemoglobin: 12.7 g/dL (ref 12.0–15.0)
MCH: 29.5 pg (ref 26.0–34.0)
MCHC: 32.3 g/dL (ref 30.0–36.0)
MCV: 91.4 fL (ref 80.0–100.0)
Platelets: 295 K/uL (ref 150–400)
RBC: 4.3 MIL/uL (ref 3.87–5.11)
RDW: 13.8 % (ref 11.5–15.5)
WBC: 7.9 K/uL (ref 4.0–10.5)
nRBC: 0 % (ref 0.0–0.2)

## 2024-07-03 LAB — LIPASE, BLOOD: Lipase: 32 U/L (ref 11–51)

## 2024-07-03 MED ORDER — IOHEXOL 300 MG/ML  SOLN
100.0000 mL | Freq: Once | INTRAMUSCULAR | Status: AC | PRN
  Administered 2024-07-03: 100 mL via INTRAVENOUS

## 2024-07-03 NOTE — ED Provider Notes (Signed)
 [place] [facility] DEPART[facility] AT [facility] Provider Note   CSN: [id number] Arrival date & time: 07/03/24  1351     Patient presents with: Abdominal Pain   Jodi Richardson is a 63 y.o. female.    Abdominal Pain  This patient is a 63 year old female presenting to the hospital with a complaint of lower abdominal discomfort.  This started on Wednesday and was more intense on the left side radiating into the suprapubic region, since that time the pain has lessened but a fullness is increasing in that suprapubic region especially when she stands up.  No fevers chills nausea vomiting or diarrhea, no constipation or blood in the stools, no dysuria.  She was initially treated for a UTI but the culture came back negative and she was advised to stop the antibiotics.  She denies any other prior abdominal surgical history.    Prior to Admission medications   Medication Sig Start Date End Date Taking? Authorizing Provider  atorvastatin (LIPITOR) 10 MG tablet Take 10 mg by mouth daily. Patient not taking: Reported on 07/01/2024 11/09/22   [provider]  Cholecalciferol (VITAMIN D3) 25 MCG (1000 UT) CHEW Chew 2,000 Units by mouth daily.    [provider]  clobetasol ointment (TEMOVATE) 0.05 % Apply 1 Application topically at bedtime.    [provider]  ezetimibe  (ZETIA ) 10 MG tablet Take 1 tablet (10 mg total) by mouth daily. Patient must call and schedule annual appointment for further refills 06/16/24   Miriam Norris, NP  latanoprost (XALATAN) 0.005 % ophthalmic solution Place 1 drop into both eyes at bedtime. 08/12/20   [provider]  losartan-hydrochlorothiazide (HYZAAR) 50-12.5 MG tablet Take 1 tablet by mouth daily.    [provider]  metoprolol  succinate (TOPROL -XL) 25 MG 24 hr tablet Take 1 tablet (25 mg total) by mouth daily. Patient taking differently: Take 25 mg by mouth as needed. 02/09/24   Santo Stanly LABOR, MD   Multiple Vitamin (MULTIVITAMIN) tablet Take 1 tablet by mouth daily.    [provider]  omeprazole (PRILOSEC) 40 MG capsule Take 1 capsule (40 mg total) by mouth daily. 07/01/24   Eartha Angelia Sieving, MD  vitamin B-12 (CYANOCOBALAMIN ) 1000 MCG tablet Take 1,000 mcg by mouth daily.    [provider]    Allergies: Patient has no known allergies.    Review of Systems  Gastrointestinal:  Positive for abdominal pain.  All other systems reviewed and are negative.   Updated Vital Signs BP (!) 147/74   Pulse 70   Temp 97.8 F (36.6 C)   Resp 18   Ht 1.651 m (5' 5)   Wt 79.8 kg   SpO2 100%   BMI 29.29 kg/m   Physical Exam Vitals and nursing note reviewed.  Constitutional:      General: She is not in acute distress.    Appearance: She is well-developed.  HENT:     Head: Normocephalic and atraumatic.     Mouth/Throat:     Pharynx: No oropharyngeal exudate.  Eyes:     General: No scleral icterus.       Right eye: No discharge.        Left eye: No discharge.     Conjunctiva/sclera: Conjunctivae normal.     Pupils: Pupils are equal, round, and reactive to light.  Neck:     Thyroid : No thyromegaly.     Vascular: No JVD.  Cardiovascular:     Rate and Rhythm: Normal rate  and regular rhythm.     Heart sounds: Normal heart sounds. No murmur heard.    No friction rub. No gallop.  Pulmonary:     Effort: Pulmonary effort is normal. No respiratory distress.     Breath sounds: Normal breath sounds. No wheezing or rales.  Abdominal:     General: Bowel sounds are normal. There is no distension.     Palpations: Abdomen is soft. There is no mass.     Tenderness: There is no abdominal tenderness.     Comments: Slight fullness in the lower abdomen, no other abdominal tenderness masses or pulsatile masses  Musculoskeletal:        General: No tenderness. Normal range of motion.     Cervical back: Normal range of motion and neck supple.     Right lower leg: No  edema.     Left lower leg: No edema.  Lymphadenopathy:     Cervical: No cervical adenopathy.  Skin:    General: Skin is warm and dry.     Findings: No erythema or rash.  Neurological:     Mental Status: She is alert.     Coordination: Coordination normal.  Psychiatric:        Behavior: Behavior normal.     (all labs ordered are listed, but only abnormal results are displayed) Labs Reviewed  COMPREHENSIVE METABOLIC PANEL WITH GFR - Abnormal; Notable for the following components:      Result Value   Creatinine, Ser 1.02 (*)    All other components within normal limits  LIPASE, BLOOD  CBC  URINALYSIS, ROUTINE W REFLEX MICROSCOPIC    EKG: None  Radiology: CT ABDOMEN PELVIS W CONTRAST Result Date: 07/03/2024 CLINICAL DATA:  Lower abdominal pain. EXAM: CT ABDOMEN AND PELVIS WITH CONTRAST TECHNIQUE: Multidetector CT imaging of the abdomen and pelvis was performed using the standard protocol following bolus administration of intravenous contrast. RADIATION DOSE REDUCTION: This exam was performed according to the departmental dose-optimization program which includes automated exposure control, adjustment of the mA and/or kV according to patient size and/or use of iterative reconstruction technique. CONTRAST:  100mL OMNIPAQUE IOHEXOL 300 MG/ML  SOLN COMPARISON:  None Available. FINDINGS: Lower chest: No acute findings Hepatobiliary: No focal hepatic abnormality. Gallbladder unremarkable. Pancreas: No focal abnormality or ductal dilatation. Spleen: No focal abnormality.  Normal size. Adrenals/Urinary Tract: No suspicious renal or adrenal abnormality. No stones or hydronephrosis. Urinary bladder unremarkable. Stomach/Bowel: Normal appendix. Sigmoid diverticulosis. No active diverticulitis. Stomach and small bowel decompressed, unremarkable. Vascular/Lymphatic: No evidence of aneurysm or adenopathy. Reproductive: Fibroid uterus.  No adnexal mass. Other: No free fluid or free air. Musculoskeletal:  No acute bony abnormality. IMPRESSION: No acute findings in the abdomen or pelvis. Sigmoid diverticulosis. Fibroid uterus. Electronically Signed   By: Franky Crease M.D.   On: 07/03/2024 17:32     Procedures   Medications Ordered in the ED  iohexol (OMNIPAQUE) 300 MG/ML solution 100 mL (100 mLs Intravenous Contrast Given 07/03/24 1712)                                    Medical Decision Making Amount and/or Complexity of Data Reviewed Labs: ordered. Radiology: ordered.  Risk Prescription drug management.    This patient presents to the ED for concern of abdominal discomfort differential diagnosis includes prolapse, appendicitis, diverticulitis, kidney stone, AAA    Additional history obtained   Additional history obtained from Electronic Medical  Record External records from outside source obtained and reviewed including medical record, no recent admissions to the hospital, had office visit with gastroenterology 2 days ago  The patient had a colonoscopy about a year and a half ago, had several polyps that were resected and retrieved, had known diverticulosis in the sigmoid and descending colon, there was some nonbleeding internal hemorrhoids   Lab Tests:  I Ordered, and personally interpreted labs.  The pertinent results include: Urinalysis negative, lipase metabolic panel and CBC unremarkable   Imaging Studies ordered:  I ordered imaging studies including CT scan of the abdomen and pelvis I independently visualized and interpreted imaging which showed diverticulosis and a fibroid uterus but no acute inflammatory findings I agree with the radiologist interpretation   Medicines ordered and prescription drug management:  I ordered medication including none    I have reviewed the patients home medicines and have made adjustments as needed   Problem List / ED Course:  Patient is well-appearing, informed of all of her results.  I had a female nurse tech chaperone at  the bedside for vaginal inspection, there is no signs of uterine or bladder prolapse   Social Determinants of Health:   None  I have discussed with the patient at the bedside the results, and the meaning of these results.  They have had opportunity to ask questions,  expressed their understanding to the need for follow-up with primary care physician       Final diagnoses:  Pelvic pain in female    ED Discharge Orders     None          Cleotilde Rogue, MD 07/03/24 1746

## 2024-07-03 NOTE — ED Notes (Signed)
 Back from CT, no changes, alert, NAD, calm. Family at [facility].

## 2024-07-03 NOTE — Discharge Instructions (Signed)
 Thankfully your CT scan did not show any specific abnormalities, you do have a fibroid uterus, this is something you can discuss with your OB/GYN but does not need immediate evaluation or treatment, in fact this is a very common finding  Tylenol  or ibuprofen as needed, ER for severe worsening symptoms

## 2024-07-03 NOTE — ED Triage Notes (Signed)
 Pt c/o lower abdominal pain since Wednesday that is worsening with movement. Was seen by PCP and ruled out UTI.  Denies fever, N/V/D. Kellogg RN

## 2024-07-03 NOTE — ED Notes (Signed)
 EDP at [facility] with chaperone, family present.

## 2024-07-03 NOTE — ED Notes (Signed)
 RDP into room, at [facility].

## 2024-07-03 NOTE — ED Notes (Signed)
 Pt alert, NAD, calm, interactive. Blood and urine lab results noted.

## 2024-07-07 ENCOUNTER — Encounter (INDEPENDENT_AMBULATORY_CARE_PROVIDER_SITE_OTHER): Payer: Self-pay | Admitting: Gastroenterology

## 2024-07-08 ENCOUNTER — Encounter (HOSPITAL_COMMUNITY): Payer: Self-pay

## 2024-07-12 ENCOUNTER — Ambulatory Visit (HOSPITAL_COMMUNITY)

## 2024-07-22 ENCOUNTER — Ambulatory Visit (HOSPITAL_COMMUNITY)

## 2024-07-26 ENCOUNTER — Encounter: Payer: Self-pay | Admitting: Radiology

## 2024-08-17 ENCOUNTER — Encounter: Payer: Self-pay | Admitting: Obstetrics & Gynecology

## 2024-08-17 ENCOUNTER — Ambulatory Visit: Admitting: Obstetrics & Gynecology

## 2024-08-17 VITALS — BP 126/73 | HR 56 | Ht 65.0 in | Wt 174.8 lb

## 2024-08-17 DIAGNOSIS — N951 Menopausal and female climacteric states: Secondary | ICD-10-CM | POA: Diagnosis not present

## 2024-08-17 DIAGNOSIS — R102 Pelvic and perineal pain unspecified side: Secondary | ICD-10-CM

## 2024-08-17 DIAGNOSIS — D259 Leiomyoma of uterus, unspecified: Secondary | ICD-10-CM

## 2024-08-17 DIAGNOSIS — L900 Lichen sclerosus et atrophicus: Secondary | ICD-10-CM

## 2024-08-17 NOTE — Progress Notes (Signed)
 GYN VISIT Patient name: Jodi Richardson MRN [id number]  Date of birth: 25-Mar-1961 Chief Complaint:   Fibroids  History of Present Illness:   Jodi Richardson is a 63 y.o. G48P1011 PM female being seen today for the following concerns:  -Pain started around 10/7-starting have some pain around her groin on left-side- continued during the night and next day moved to lower abdomen. Pain lasted for about 3 days- worse with movement, noted feeling pressure with walking.  Took a little tylenol  with some improvement.  Eventually pain gradually went away- since that time no pain.     No vaginal bleeding, discharge, itching or irritation.  Initially, thought it was GI related also noted increased burping- seen by PCP no abnormalities noted.  Seen by PCP- ruled out for UTI.  Then went to ER- had CT that showed a uterine fibroid.    Notes h/o lichen sclerosis- using clobetasol- every now and then- once a week  Notes hot flashes- for years and have not gotten any better.  She also notes night sweats.  Rates her symptoms 7/10.  She has not tried any remedies for this issue.  No LMP recorded. Patient is postmenopausal.    Review of Systems:   Pertinent items are noted in HPI Denies fever/chills, dizziness, headaches, visual disturbances, fatigue, shortness of breath, chest pain.  Denies urinary concerns.  Denies incontinence.  Denies any GI issues currently. Pertinent History Reviewed:   Past Surgical History:  Procedure Laterality Date   COLONOSCOPY WITH PROPOFOL  N/A 01/03/2023   Procedure: COLONOSCOPY WITH PROPOFOL ;  Surgeon: Eartha Angelia Sieving, MD;  [facility];  Service: Gastroenterology;  Laterality: N/A;  8:45 AM, ASA 2   NO PAST SURGERIES     none     POLYPECTOMY  01/03/2023   Procedure: POLYPECTOMY;  Surgeon: Eartha Angelia Sieving, MD;  [facility]: [facility];  Service: Gastroenterology;;    Past Medical History:  Diagnosis Date   Glaucoma    High cholesterol     Hypertension    Tachycardia    Reviewed problem list, medications and allergies. Physical Assessment:   Vitals:   08/17/24 1410  BP: 126/73  Pulse: (!) 56  Weight: 174 lb 12.8 oz (79.3 kg)  Height: 5' 5 (1.651 m)  Body mass index is 29.09 kg/m.       Physical Examination:   General appearance: alert, well appearing, and in no distress  Psych: mood appropriate, normal affect  Skin: warm & dry   Cardiovascular: normal heart rate noted  Respiratory: normal respiratory effort, no distress  Abdomen: soft, non-tender, no rebound or guarding.  No reproducible pain  Back: No CVA tenderness bilaterally  Pelvic: VULVA: normal appearing vulva with no masses, tenderness or lesions, VAGINA: normal appearing vagina with normal color and discharge, no lesions, CERVIX: normal appearing cervix without discharge or lesions, UTERUS: uterus is normal size, shape, consistency and nontender, ADNEXA: normal adnexa in size, nontender and no masses  Extremities: no edema   Chaperone: Alan Fischer    Assessment & Plan:  1) Pelvic pain -based on clinical history and imaging, do not suspect uterine fibroid is causing pain.   - Assured patient that no abnormalities were noted on exam.  And no evidence of pelvic organ prolapse  -suspect musculoskeletal or osteoarthritis -plan for pelvic US  to r/o underlying etiology  2) Vasomotor symptoms - Discussed both conservative and medical management options.  -Discussed both estrogen/HRT, [place], alternative options such as gabapentin or antidepressants -  Patient plans to do some reading on her own and will likely try herbal supplement, []  follow-up in 6 months  3) Lichen sclerosus - Continue using clobetasol weekly as needed   No orders of the defined types were placed in this encounter.   Return in about 6 months (around 02/14/2025) for follow up hot flashes/ pelvic US  next available.   Jocsan Mcginley, DO Attending Obstetrician & Gynecologist,  [facility] for [employer], [facility]

## 2024-08-23 ENCOUNTER — Encounter (HOSPITAL_COMMUNITY): Payer: Self-pay

## 2024-08-25 ENCOUNTER — Ambulatory Visit: Payer: Self-pay

## 2024-08-26 ENCOUNTER — Encounter: Admitting: Obstetrics & Gynecology

## 2024-09-21 ENCOUNTER — Ambulatory Visit: Attending: Nurse Practitioner | Admitting: Nurse Practitioner

## 2024-09-21 ENCOUNTER — Encounter: Payer: Self-pay | Admitting: Nurse Practitioner

## 2024-09-21 VITALS — BP 110/62 | HR 64 | Ht 65.0 in | Wt 177.8 lb

## 2024-09-21 DIAGNOSIS — R0789 Other chest pain: Secondary | ICD-10-CM | POA: Diagnosis not present

## 2024-09-21 DIAGNOSIS — E782 Mixed hyperlipidemia: Secondary | ICD-10-CM

## 2024-09-21 DIAGNOSIS — I10 Essential (primary) hypertension: Secondary | ICD-10-CM

## 2024-09-21 DIAGNOSIS — I471 Supraventricular tachycardia, unspecified: Secondary | ICD-10-CM | POA: Diagnosis not present

## 2024-09-21 DIAGNOSIS — R5383 Other fatigue: Secondary | ICD-10-CM | POA: Diagnosis not present

## 2024-09-21 MED ORDER — METOPROLOL SUCCINATE ER 25 MG PO TB24: 12.5000 mg | ORAL_TABLET | Freq: Every day | ORAL | 3 refills | Status: AC

## 2024-09-21 NOTE — Patient Instructions (Addendum)
 Medication Instructions:  Your physician has recommended you make the following change in your medication:  Decrease your Metoprolol  Succinate to 12.5 mg once daily Continue taking all other medications as prescribed   Labwork: None  Testing/Procedures: None  Follow-Up: Your physician recommends that you schedule a follow-up appointment in: 2-3 months  Any Other Special Instructions Will Be Listed Below (If Applicable). Thank you for choosing [facility]!     If you need a refill on your cardiac medications before your next appointment, please call your pharmacy.

## 2024-09-21 NOTE — Progress Notes (Unsigned)
 " Cardiology Office Note:  .   Date: 09/21/2024 ID:  Jodi Richardson, DOB 1960/12/06, MRN [id number] PCP: Practice, [facility]  [facility] Providers Cardiologist:  Stanly DELENA Leavens, MD    History of Present Illness: .   Jodi Richardson is a 63 y.o. female with a PMH of PSVT, mild TR, and FH (followed by [facility]), HTN, who presents today for 1 year follow-up.   Last seen by Dr. Leavens on March 13, 2022. Was overall doing well at the time. TTE 03/2022 was benign.   09/15/2024 - Today she presents for 1 year follow-up. She states she is doing well. Denies any acute cardiac complaints or issues. Denies any chest pain, shortness of breath, palpitations, syncope, presyncope, dizziness, orthopnea, PND, swelling or significant weight changes, acute bleeding, or claudication.  09/21/2024 - She is here for 1 year follow-up. Overall doing fairly well. She does tell me about some fatigue she is noticing. Just a one time episode of dizziness without any recurrences. Denies any chest pain, shortness of breath, palpitations, syncope, presyncope, orthopnea, PND, swelling or significant weight changes, acute bleeding, or claudication. She does tell me about past call to our office in October, describes episodes of intermittent atypical CP under left breast, noted palpitations with her episodes. Dr. Leavens stated that for atypical chest pain in patient with FH, he recommended CCTA and stated if no obstructive disease would need f/u with PCP for GERD management - see telephone note dated 06/25/2024.   ROS: Negative. See HPI.  SH: Works as consulting civil engineer   Studies Reviewed: SABRA    EKG: EKG Interpretation Date/Time:  Tuesday September 21 2024 14:33:12 EST Ventricular Rate:  64 PR Interval:  158 QRS Duration:  72 QT Interval:  428 QTC Calculation: 441 R Axis:   64  Text Interpretation: Normal sinus rhythm Nonspecific ST abnormality When compared with ECG of 16-Sep-2023  10:06, No significant change was found Confirmed by Miriam Norris [phone]) on 09/21/2024 2:44:01 PM   Echo 03/2022:   1. Left ventricular ejection fraction, by estimation, is 65 to 70%. The  left ventricle has normal function. The left ventricle has no regional  wall motion abnormalities. Left ventricular diastolic parameters were  normal. The average left ventricular global longitudinal strain is -24.2 %. The global longitudinal strain is normal.   2. Right ventricular systolic function is normal. The right ventricular  size is normal. There is normal pulmonary artery systolic pressure. The estimated right ventricular systolic pressure is 21.0 mmHg.   3. The mitral valve is grossly normal. No evidence of mitral valve  regurgitation.   4. The aortic valve is tricuspid. Aortic valve regurgitation is not  visualized.   5. The inferior vena cava is normal in size with greater than 50%  respiratory variability, suggesting right atrial pressure of 3 mmHg.   Comparison(s): Prior images unable to be directly viewed.   Cardiac monitor 09/2020:  Sinus rhythm   Heart rates 50 to 171 bpm   Average HR 81 bpm    Occasional PACs, short bursts of SVT   Longest spell of SVT was approximately 16 seconds   170 bpm   Not sensed   Occurred 1:30 AM Diary entries:  One fluttering sensation correlated with short burst SVT  Other times it was SR.  Other sensations (pressure) correlated with SR  Physical Exam:   VS:  BP 110/62 (BP Location: Left Arm, Cuff Size: Large)   Pulse 64   Ht 5'  5 (1.651 m)   Wt 177 lb 12.8 oz (80.6 kg)   SpO2 99%   BMI 29.59 kg/m    Wt Readings from Last 3 Encounters:  09/21/24 177 lb 12.8 oz (80.6 kg)  08/17/24 174 lb 12.8 oz (79.3 kg)  07/03/24 176 lb (79.8 kg)    GEN: Well nourished, well developed in no acute distress NECK: No JVD; No carotid bruits CARDIAC: S1/S2, slow rate and regular rhythm, no murmurs, rubs, gallops RESPIRATORY:  Clear to auscultation without rales,  wheezing or rhonchi  ABDOMEN: Soft, non-tender, non-distended EXTREMITIES:  No edema; No deformity   ASSESSMENT AND PLAN: .    Atypical chest pain Describes atypical symptoms - see telephone note dated from 06/2024. Revisited this today and will get scheduled CCTA for her. No med changes at this time. Heart healthy diet and regular cardiovascular exercise encouraged. Care and ED precautions discussed.   PSVT Denies any tachycardia or palpitations. Will reduce Toprol  XL as noted below. Heart healthy diet and regular cardiovascular exercise encouraged.   HTN BP stable and at goal. Discussed to monitor BP at home at least 2 hours after medications and sitting for 5-10 minutes. Heart healthy diet and regular cardiovascular exercise encouraged.   Mixed HLD Most recent labs from PCP revealed LDL of 153. Being managed by PCP, but hx of statin intolerance. Discussed PCSK9i and benefits of therapy, has a FH of familial hyperlipidemia. Discussed [facility] referral and she is agreeable to this - will place referral. Heart healthy diet and regular cardiovascular exercise encouraged.    5. Fatigue Etiology multifactorial. Will reduce Toprol  XL to see if this helps. Continue to follow with PCP.   Dispo: Follow-up with MD or APP in 2-3 months or sooner if anything changes.   Signed, Almarie Crate, NP   "

## 2024-09-22 ENCOUNTER — Other Ambulatory Visit

## 2024-09-30 ENCOUNTER — Encounter: Admitting: Adult Health

## 2024-10-07 ENCOUNTER — Ambulatory Visit (INDEPENDENT_AMBULATORY_CARE_PROVIDER_SITE_OTHER): Admitting: Gastroenterology

## 2024-10-29 ENCOUNTER — Ambulatory Visit

## 2024-11-12 ENCOUNTER — Ambulatory Visit

## 2024-11-16 ENCOUNTER — Ambulatory Visit: Payer: Self-pay | Admitting: Nurse Practitioner

## 2024-11-19 ENCOUNTER — Ambulatory Visit: Payer: Self-pay | Admitting: Pharmacist
